# Patient Record
Sex: Male | Born: 1970 | Race: Black or African American | Hispanic: No | Marital: Married | Smoking: Never smoker
Health system: Southern US, Community
[De-identification: ages and names within clinical notes are randomized; demographics above are authoritative.]

## PROBLEM LIST (undated history)

## (undated) DIAGNOSIS — K219 Gastro-esophageal reflux disease without esophagitis: Secondary | ICD-10-CM

## (undated) DIAGNOSIS — IMO0002 Reserved for concepts with insufficient information to code with codable children: Secondary | ICD-10-CM

## (undated) DIAGNOSIS — I10 Essential (primary) hypertension: Secondary | ICD-10-CM

## (undated) DIAGNOSIS — K922 Gastrointestinal hemorrhage, unspecified: Secondary | ICD-10-CM

## (undated) DIAGNOSIS — F1010 Alcohol abuse, uncomplicated: Secondary | ICD-10-CM

---

## 1997-12-02 ENCOUNTER — Encounter: Payer: Self-pay | Admitting: Emergency Medicine

## 1997-12-02 ENCOUNTER — Emergency Department (HOSPITAL_COMMUNITY): Admission: EM | Admit: 1997-12-02 | Discharge: 1997-12-02 | Payer: Self-pay | Admitting: Emergency Medicine

## 2006-11-13 ENCOUNTER — Emergency Department (HOSPITAL_COMMUNITY): Admission: EM | Admit: 2006-11-13 | Discharge: 2006-11-13 | Payer: Self-pay | Admitting: Emergency Medicine

## 2007-12-30 ENCOUNTER — Emergency Department (HOSPITAL_COMMUNITY): Admission: EM | Admit: 2007-12-30 | Discharge: 2007-12-30 | Payer: Self-pay | Admitting: Emergency Medicine

## 2008-04-08 ENCOUNTER — Emergency Department (HOSPITAL_COMMUNITY): Admission: EM | Admit: 2008-04-08 | Discharge: 2008-04-08 | Payer: Self-pay | Admitting: Family Medicine

## 2008-10-29 ENCOUNTER — Inpatient Hospital Stay (HOSPITAL_COMMUNITY): Admission: EM | Admit: 2008-10-29 | Discharge: 2008-10-29 | Payer: Self-pay | Admitting: Emergency Medicine

## 2010-06-26 LAB — TYPE AND SCREEN
ABO/RH(D): A POS
Antibody Screen: NEGATIVE

## 2010-06-26 LAB — BASIC METABOLIC PANEL
Calcium: 9.1 mg/dL (ref 8.4–10.5)
GFR calc Af Amer: 54 mL/min — ABNORMAL LOW (ref >60)
GFR calc non Af Amer: 45 mL/min — ABNORMAL LOW (ref >60)
Glucose, Bld: 119 mg/dL — ABNORMAL HIGH (ref 70–99)
Sodium: 140 mEq/L (ref 135–145)

## 2010-06-26 LAB — CBC
Hemoglobin: 14.3 g/dL (ref 13.0–17.0)
Platelets: 303 10*3/uL (ref 150–400)
RDW: 13.4 % (ref 11.5–15.5)

## 2010-06-26 LAB — DIFFERENTIAL
Basophils Absolute: 0.1 10*3/uL (ref 0.0–0.1)
Lymphocytes Relative: 44 % (ref 12–46)
Monocytes Absolute: 0.9 10*3/uL (ref 0.1–1.0)
Neutro Abs: 6.1 10*3/uL (ref 1.7–7.7)

## 2010-06-26 LAB — ETHANOL: Alcohol, Ethyl (B): 190 mg/dL — ABNORMAL HIGH (ref 0–10)

## 2010-06-26 LAB — ABO/RH: ABO/RH(D): A POS
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
## 2011-05-10 ENCOUNTER — Encounter (HOSPITAL_COMMUNITY): Payer: Self-pay | Admitting: Emergency Medicine

## 2011-05-10 ENCOUNTER — Other Ambulatory Visit: Payer: Self-pay

## 2011-05-10 ENCOUNTER — Emergency Department (INDEPENDENT_AMBULATORY_CARE_PROVIDER_SITE_OTHER)
Admission: EM | Admit: 2011-05-10 | Discharge: 2011-05-10 | Disposition: A | Discharge status: Home / Self Care | Payer: Self-pay | Source: Home / Self Care | Attending: Family Medicine | Admitting: Family Medicine

## 2011-05-10 DIAGNOSIS — I10 Essential (primary) hypertension: Secondary | ICD-10-CM

## 2011-05-10 HISTORY — DX: Gastrointestinal hemorrhage, unspecified: K92.2

## 2011-05-10 HISTORY — DX: Essential (primary) hypertension: I10

## 2011-05-10 HISTORY — DX: Reserved for concepts with insufficient information to code with codable children: IMO0002

## 2011-05-10 LAB — POCT I-STAT, CHEM 8
BUN: 8 mg/dL (ref 6–23)
Chloride: 105 mEq/L (ref 96–112)
Sodium: 141 mEq/L (ref 135–145)

## 2011-05-10 MED ORDER — AMLODIPINE BESYLATE 10 MG PO TABS: 10.0000 mg | ORAL_TABLET | Freq: Every day | ORAL | Status: DC

## 2011-05-10 MED ORDER — HYDROCHLOROTHIAZIDE 25 MG PO TABS: 25.0000 mg | ORAL_TABLET | Freq: Every day | ORAL | Status: DC

## 2011-05-10 NOTE — ED Notes (Addendum)
Pt is oriented x 3 and speaking full sentences without any difficulty breathing. Pt states numbness to the Lt hand finger tips and headache and restless.

## 2011-05-10 NOTE — ED Notes (Signed)
Patient reports intermittent headaches, hand swelling, and restlessness.  Patient has been off medicine for one year.  Patient denies anything new today, just time to do something about it.  Patient denies any pain or sob today.  Reports having brief episodes of can't catch his breath or brief pain in left chest.  Patient reports these episodes of pain or sob do not occur together necessarily, and episodes are very brief: "one to two seconds".

## 2011-05-10 NOTE — ED Provider Notes (Signed)
History     CSN: [id number]  Arrival date & time 05/10/11  4098   First MD Initiated Contact with Patient 05/10/11 1029      Chief Complaint  Patient presents with  . Hypertension    (Consider location/radiation/quality/duration/timing/severity/associated sxs/prior treatment) HPI Comments: Holly presents for evaluation for left hand, tingling, and elevated blood pressure. He reports a history of hypertension, but has not taken his medications in over a year secondary to loss of insurance. He reports he has been on multiple regimens in the past. He reports a significant family history of his mother, who recently passed away from kidney failure from uncontrolled hypertension. He also reports intermittent headaches, but denies one at the current moment. He also reports intermittent left-sided chest pain, but denies any chest pain at the moment. No shortness of breath. No vision changes. No nausea. No vomiting. He is self-employed with a Clinical research associate business and does not have insurance at this moment.  Patient is a 41 y.o. male presenting with hypertension. The history is provided by the patient.  Hypertension This is a chronic problem. The problem has not changed since onset.Associated symptoms include chest pain and headaches. Pertinent negatives include no shortness of breath. The symptoms are aggravated by nothing. The symptoms are relieved by nothing.    Past Medical History  Diagnosis Date  . Hypertension   . Ulcer   . GI bleed     History reviewed. No pertinent past surgical history.  History reviewed. No pertinent family history.  History  Substance Use Topics  . Smoking status: Never Smoker   . Smokeless tobacco: Not on file  . Alcohol Use: Yes      Review of Systems  Constitutional: Negative.   Eyes: Negative.   Respiratory: Negative.  Negative for shortness of breath.   Cardiovascular: Positive for chest pain. Negative for palpitations.  Gastrointestinal:  Negative.   Genitourinary: Negative.   Musculoskeletal: Negative.   Skin: Negative.   Neurological: Positive for headaches. Negative for dizziness, weakness and numbness.    Allergies  Review of patient's allergies indicates no known allergies.  Home Medications   Current Outpatient Rx  Name Route Sig Dispense Refill  . AMLODIPINE BESYLATE 10 MG PO TABS Oral Take 1 tablet (10 mg total) by mouth daily. 30 tablet 1  . HYDROCHLOROTHIAZIDE 25 MG PO TABS Oral Take 1 tablet (25 mg total) by mouth daily. 30 tablet 1    BP 200/120  Pulse 76  Temp(Src) 99 F (37.2 C) (Oral)  Resp 16  SpO2 97%  Physical Exam  Nursing note and vitals reviewed. Constitutional: He is oriented to person, place, and time. He appears well-developed and well-nourished.  HENT:  Head: Normocephalic and atraumatic.  Right Ear: Tympanic membrane normal.  Left Ear: Tympanic membrane normal.  Mouth/Throat: Uvula is midline, oropharynx is clear and moist and mucous membranes are normal.  Eyes: Conjunctivae and EOM are normal. Pupils are equal, round, and reactive to light.  Neck: Normal range of motion.  Cardiovascular: Normal rate, regular rhythm, S1 normal, S2 normal, normal heart sounds and normal pulses.   No murmur heard.      ECG, NSR, 69 bpm, no ST-T wave changes  Pulmonary/Chest: Effort normal and breath sounds normal. He has no decreased breath sounds. He has no wheezes. He has no rhonchi.  Musculoskeletal: Normal range of motion.  Neurological: He is alert and oriented to person, place, and time.  Skin: Skin is warm and dry. He is not diaphoretic.  Psychiatric: His behavior is normal.    ED Course  Procedures (including critical care time)  Labs Reviewed  POCT I-STAT, CHEM 8 - Abnormal; Notable for the following:    Creatinine, Ser 1.40 (*)    Glucose, Bld 102 (*)    All other components within normal limits   No results found.   1. Hypertension       MDM  Labs reviewed; creatinine  1.40; down from 1.75 in 2010; will not start ACE inhibitor at this time; start HCTZ and amlodipine; return in 7 - 10 days for BP check; given [facility] information        Richardo Priest, MD 05/10/11 1736

## 2011-05-10 NOTE — Discharge Instructions (Signed)
Your blood pressure is elevated today. Your EKG was normal today. Your kidney function shows an abnormal level as well; normal is < 1. Your level is 1.40. Therefore, I am not comfortable starting an ACE inhibitor (such as lisinopril) at this time. I have started another type of medication, a calcium channel blocker called amlodipine, along with a diuretic, hydrochlorothiazide. Return in 7 to 10 days for re-evaluation of your pressure. You also need to establish care with a primary care provider as discussed. I have provided information for two clinics that do not require insurance. You may also call [facility] at [phone] and ask about primary care clinics: the Department of Family Medicine [phone] - [phone], or the Department of Internal Medicine. Start medications as directed. Return to care, or to the Emergency Department, should you develop any symptoms not improve, or your health worsens in any way, such as chest pain, shortness of breath, vision changes, nausea, vomiting, numbness, tingling, or weakness.

## 2011-12-23 ENCOUNTER — Emergency Department (HOSPITAL_COMMUNITY): Payer: Self-pay

## 2011-12-23 ENCOUNTER — Emergency Department (HOSPITAL_COMMUNITY)
Admission: EM | Admit: 2011-12-23 | Discharge: 2011-12-23 | Disposition: A | Discharge status: Home / Self Care | Payer: Self-pay | Attending: Emergency Medicine | Admitting: Emergency Medicine

## 2011-12-23 ENCOUNTER — Encounter (HOSPITAL_COMMUNITY): Payer: Self-pay

## 2011-12-23 DIAGNOSIS — J40 Bronchitis, not specified as acute or chronic: Secondary | ICD-10-CM

## 2011-12-23 DIAGNOSIS — I10 Essential (primary) hypertension: Secondary | ICD-10-CM

## 2011-12-23 DIAGNOSIS — R05 Cough: Secondary | ICD-10-CM

## 2011-12-23 DIAGNOSIS — R059 Cough, unspecified: Secondary | ICD-10-CM

## 2011-12-23 MED ORDER — ALBUTEROL SULFATE HFA 108 (90 BASE) MCG/ACT IN AERS: 1.0000 | INHALATION_SPRAY | Freq: Four times a day (QID) | RESPIRATORY_TRACT | Status: DC | PRN

## 2011-12-23 MED ORDER — ALBUTEROL SULFATE (5 MG/ML) 0.5% IN NEBU
5.0000 mg | INHALATION_SOLUTION | Freq: Once | RESPIRATORY_TRACT | Status: AC
  Administered 2011-12-23: 5 mg via RESPIRATORY_TRACT
  Filled 2011-12-23: qty 1

## 2011-12-23 MED ORDER — AMLODIPINE BESYLATE 10 MG PO TABS
10.0000 mg | ORAL_TABLET | Freq: Once | ORAL | Status: AC
  Administered 2011-12-23: 10 mg via ORAL
  Filled 2011-12-23: qty 1

## 2011-12-23 MED ORDER — HYDROCHLOROTHIAZIDE 12.5 MG PO TABS: 25.0000 mg | ORAL_TABLET | Freq: Every day | ORAL | Status: DC

## 2011-12-23 MED ORDER — AMLODIPINE BESYLATE 2.5 MG PO TABS: 10.0000 mg | ORAL_TABLET | Freq: Every day | ORAL | Status: DC

## 2011-12-23 MED ORDER — HYDROCHLOROTHIAZIDE 12.5 MG PO CAPS
25.0000 mg | ORAL_CAPSULE | Freq: Once | ORAL | Status: AC
  Administered 2011-12-23: 25 mg via ORAL
  Filled 2011-12-23: qty 2

## 2011-12-23 NOTE — ED Notes (Signed)
Patient reports chest congestion and cough x3 weeks. Pt denies history of sinus congestion or fevers. Pt denies productive cough.   Pt reports blood pressure is high today because he is out of his BP medication x months.  Pt states he is unable to see a regular MD as he lack insurance.

## 2011-12-23 NOTE — ED Provider Notes (Addendum)
History     CSN: [id number]  Arrival date & time 12/23/11  1304   First MD Initiated Contact with Patient 12/23/11 1543      Chief Complaint  Patient presents with  . Shortness of Breath  . Cough    (Consider location/radiation/quality/duration/timing/severity/associated sxs/prior treatment) The history is provided by the patient.  Gatlin Kittell is a 41 y.o. male hx of HTN here with cough, SOB. Cough and congestion for 3 weeks. Nonproductive cough. Felt SOB when he coughs. No fevers or chills. No hx of PE or recent travel or leg swelling. SOB not exertional. No sinus congestion or sore throat. He is uncompliant on his HTN meds and ran out of it several months ago. He is not a smoker.    Past Medical History  Diagnosis Date  . Hypertension   . Ulcer   . GI bleed     History reviewed. No pertinent past surgical history.  No family history on file.  History  Substance Use Topics  . Smoking status: Never Smoker   . Smokeless tobacco: Not on file  . Alcohol Use: Yes     occaionally      Review of Systems  Respiratory: Positive for cough and shortness of breath.   All other systems reviewed and are negative.    Allergies  Review of patient's allergies indicates no known allergies.  Home Medications   Current Outpatient Rx  Name Route Sig Dispense Refill  . ACETAMINOPHEN 325 MG RE SUPP Rectal Place 650 mg rectally every 4 (four) hours as needed.    Marland Kitchen AMLODIPINE BESYLATE 10 MG PO TABS Oral Take 1 tablet (10 mg total) by mouth daily. 30 tablet 1  . HYDROCHLOROTHIAZIDE 25 MG PO TABS Oral Take 1 tablet (25 mg total) by mouth daily. 30 tablet 1  . ALBUTEROL SULFATE HFA 108 (90 BASE) MCG/ACT IN AERS Inhalation Inhale 1-2 puffs into the lungs every 6 (six) hours as needed for wheezing. 1 Inhaler 0  . AMLODIPINE BESYLATE 2.5 MG PO TABS Oral Take 4 tablets (10 mg total) by mouth daily. 30 tablet 0  . HYDROCHLOROTHIAZIDE 12.5 MG PO TABS Oral Take 2 tablets (25 mg total) by  mouth daily. 30 tablet 0    BP 170/110  Pulse 103  Temp 98.7 F (37.1 C) (Oral)  Resp 20  Wt 240 lb (108.863 kg)  SpO2 96%  Physical Exam  Nursing note and vitals reviewed. Constitutional: He is oriented to person, place, and time. He appears well-developed and well-nourished.  HENT:  Head: Normocephalic.  Mouth/Throat: Oropharynx is clear and moist.  Eyes: Conjunctivae normal are normal. Pupils are equal, round, and reactive to light.  Neck: Normal range of motion. Neck supple.  Cardiovascular: Normal rate, regular rhythm and normal heart sounds.   Pulmonary/Chest: Effort normal and breath sounds normal. No respiratory distress. He has no wheezes. He has no rales.  Abdominal: Soft. Bowel sounds are normal.  Musculoskeletal: Normal range of motion. He exhibits no edema and no tenderness.  Neurological: He is alert and oriented to person, place, and time.  Skin: Skin is warm and dry.  Psychiatric: He has a normal mood and affect. His behavior is normal. Judgment and thought content normal.    ED Course  Procedures (including critical care time)  Labs Reviewed - No data to display Dg Chest 2 View  12/23/2011  *RADIOLOGY REPORT*  Clinical Data: Wheezing.  Short of breath.  Cough.  CHEST - 2 VIEW  Comparison: 10/29/2008  Findings: Heart size is normal.  Mediastinal shadows are normal. Lungs are clear.  No effusions.  There are be mild central bronchial thickening.  No acute bony finding.  IMPRESSION: No infiltrate or collapse.  Possible mild central bronchial thickening.   Original Report Authenticated By: Thomasenia Sales, M.D.      1. Bronchitis   2. Cough   3. Hypertension      Date: 12/23/2011  Rate: 104  Rhythm: normal sinus rhythm  QRS Axis: normal  Intervals: normal  ST/T Wave abnormalities: normal  Conduction Disutrbances:none  Narrative Interpretation:   Old EKG Reviewed: unchanged     MDM  Jaze Colantonio is a 41 y.o. male here with HTN and cough. Cough x 3  weeks. Likely viral syndrome. Will get CXR r/o pneumonia. Will give him his antihypertensive meds. Not concerning for PE and PERC negative (the HR on the monitor is 90s not 103).   5:38 PM CXR showed bronchitis and no pneumonia. Patient felt better after nebs and given BP meds. Repeat vitals showed BP stable, not tachycardic. Will give patient refill of his meds and give albuterol for bronchitis. Will give resource guide.        Richardean Canal, MD 12/23/11 1739  Richardean Canal, MD 12/23/11 [phone]

## 2011-12-23 NOTE — ED Notes (Signed)
Pt c/o sob x 3 weeks with cough. Sat 96% on RA., reports chest congestion. Denied pain

## 2012-05-09 ENCOUNTER — Emergency Department (INDEPENDENT_AMBULATORY_CARE_PROVIDER_SITE_OTHER)
Admission: EM | Admit: 2012-05-09 | Discharge: 2012-05-09 | Disposition: A | Discharge status: Home / Self Care | Payer: Self-pay | Source: Home / Self Care | Attending: Emergency Medicine | Admitting: Emergency Medicine

## 2012-05-09 ENCOUNTER — Encounter (HOSPITAL_COMMUNITY): Payer: Self-pay

## 2012-05-09 DIAGNOSIS — I10 Essential (primary) hypertension: Secondary | ICD-10-CM

## 2012-05-09 DIAGNOSIS — N289 Disorder of kidney and ureter, unspecified: Secondary | ICD-10-CM

## 2012-05-09 LAB — POCT I-STAT, CHEM 8
BUN: 14 mg/dL (ref 6–23)
Chloride: 106 mEq/L (ref 96–112)
Creatinine, Ser: 1.4 mg/dL — ABNORMAL HIGH (ref 0.50–1.35)
Potassium: 4.2 mEq/L (ref 3.5–5.1)
Sodium: 140 mEq/L (ref 135–145)

## 2012-05-09 MED ORDER — HYDROCHLOROTHIAZIDE 12.5 MG PO TABS: 25.0000 mg | ORAL_TABLET | Freq: Every day | ORAL | Status: AC

## 2012-05-09 NOTE — ED Notes (Signed)
RN Harriett Sine informed of pts High BP

## 2012-05-09 NOTE — ED Notes (Signed)
Pt states he has not been on BP medication since his last visit ( 1 year ago) Discussed hazards of unregulated BP

## 2012-05-09 NOTE — ED Provider Notes (Addendum)
History     CSN: [id number]  Arrival date & time 05/09/12  1040   First MD Initiated Contact with Patient 05/09/12 1130      Chief Complaint  Patient presents with  . Hypertension    (Consider location/radiation/quality/duration/timing/severity/associated sxs/prior treatment) HPI Comments: Patient presents urgent care almost a year after his last visit. Requesting blood pressure medicines. Patient describes that he has been without taking medicines for many months now. Currently denies any chest pains, shortness of breath or lower extremity swelling. Does describe that occasionally he feels somewhat with a headache that he attributes to have his blood pressure elevated. This time he feels he will establish with a primary care Dr. Marland Kitchen if I don't do it my wife will make me so".  Patient is a 42 y.o. male presenting with hypertension. The history is provided by the patient.  Hypertension This is a chronic problem. The problem has not changed since onset.Associated symptoms include headaches. Pertinent negatives include no chest pain and no shortness of breath. Nothing aggravates the symptoms. He has tried nothing for the symptoms.    Past Medical History  Diagnosis Date  . Hypertension   . Ulcer   . GI bleed     History reviewed. No pertinent past surgical history.  Family History  Problem Relation Age of Onset  . Diabetes Mother   . Hyperlipidemia Mother     History  Substance Use Topics  . Smoking status: Never Smoker   . Smokeless tobacco: Not on file  . Alcohol Use: Yes     Comment: occaionally      Review of Systems  Constitutional: Positive for chills. Negative for activity change and appetite change.  Respiratory: Negative for cough and shortness of breath.   Cardiovascular: Negative for chest pain, palpitations and leg swelling.  Neurological: Positive for headaches. Negative for dizziness, facial asymmetry, speech difficulty, weakness and numbness.     Allergies  Review of patient's allergies indicates no known allergies.  Home Medications   Current Outpatient Rx  Name  Route  Sig  Dispense  Refill  . acetaminophen (TYLENOL) 325 MG suppository   Rectal   Place 650 mg rectally every 4 (four) hours as needed.         Marland Kitchen albuterol (PROVENTIL HFA;VENTOLIN HFA) 108 (90 BASE) MCG/ACT inhaler   Inhalation   Inhale 1-2 puffs into the lungs every 6 (six) hours as needed for wheezing.   1 Inhaler   0   . amLODipine (NORVASC) 10 MG tablet   Oral   Take 1 tablet (10 mg total) by mouth daily.   30 tablet   1   . amLODipine (NORVASC) 2.5 MG tablet   Oral   Take 4 tablets (10 mg total) by mouth daily.   30 tablet   0   . hydrochlorothiazide (HYDRODIURIL) 12.5 MG tablet   Oral   Take 2 tablets (25 mg total) by mouth daily.   30 tablet   0   . hydrochlorothiazide (HYDRODIURIL) 25 MG tablet   Oral   Take 1 tablet (25 mg total) by mouth daily.   30 tablet   1     BP 178/102  Pulse 64  Temp(Src) 98.2 F (36.8 C) (Oral)  Resp 18  SpO2 96%  Physical Exam  Vitals reviewed. Constitutional: He is oriented to person, place, and time. He appears well-developed and well-nourished.  Cardiovascular: Normal rate, regular rhythm and normal heart sounds.  Exam reveals no gallop and no friction  rub.   No murmur heard. Pulmonary/Chest: Effort normal and breath sounds normal.  Neurological: He is alert and oriented to person, place, and time. He has normal strength and normal reflexes. He displays normal reflexes. No cranial nerve deficit or sensory deficit. He exhibits normal muscle tone. Coordination normal.  Skin: Skin is warm.    ED Course  Procedures (including critical care time)  Labs Reviewed  POCT I-STAT, CHEM 8 - Abnormal; Notable for the following:    Creatinine, Ser 1.40 (*)    Hemoglobin 17.3 (*)    All other components within normal limits   No results found.   1. Hypertension      Hand held device   Normal sinus rhythm, ventricular rate 70 beats per minute. No ST or T wave changes suggestive of acute or chronic ischemic events.  MDM  Patient will be restarted on hydrochlorothiazide. Information has been provided to establish continuity of care with the active adult Center at [facility]. Baseline electrolytes and creatinine obtained today. Patient agrees with current treatment initiation and with close follow-up. We discussed the short-term and long-term risk associated with untreated oral control blood pressure. Patient understands and agrees with treatment plan  Abnormal creatinine levels were discussed with patient. Has an indication of some degree of renal insufficiency. Have recommended patient that he will need close followup of his creatinine levels as his blood pressure gets under control. Patient understands and agrees.      Jimmie Molly, MD 05/09/12 1221  Jimmie Molly, MD 05/09/12 [phone]

## 2012-08-30 ENCOUNTER — Encounter (HOSPITAL_COMMUNITY): Payer: Self-pay

## 2012-08-30 ENCOUNTER — Emergency Department (HOSPITAL_COMMUNITY): Payer: Worker's Compensation

## 2012-08-30 ENCOUNTER — Emergency Department (HOSPITAL_COMMUNITY)
Admission: EM | Admit: 2012-08-30 | Discharge: 2012-08-30 | Disposition: A | Discharge status: Home / Self Care | Payer: Worker's Compensation | Attending: Emergency Medicine | Admitting: Emergency Medicine

## 2012-08-30 DIAGNOSIS — S62639A Displaced fracture of distal phalanx of unspecified finger, initial encounter for closed fracture: Secondary | ICD-10-CM | POA: Insufficient documentation

## 2012-08-30 DIAGNOSIS — Y9289 Other specified places as the place of occurrence of the external cause: Secondary | ICD-10-CM | POA: Insufficient documentation

## 2012-08-30 DIAGNOSIS — Y9389 Activity, other specified: Secondary | ICD-10-CM | POA: Insufficient documentation

## 2012-08-30 DIAGNOSIS — R209 Unspecified disturbances of skin sensation: Secondary | ICD-10-CM | POA: Insufficient documentation

## 2012-08-30 DIAGNOSIS — Y990 Civilian activity done for income or pay: Secondary | ICD-10-CM | POA: Insufficient documentation

## 2012-08-30 DIAGNOSIS — W230XXA Caught, crushed, jammed, or pinched between moving objects, initial encounter: Secondary | ICD-10-CM | POA: Insufficient documentation

## 2012-08-30 DIAGNOSIS — I10 Essential (primary) hypertension: Secondary | ICD-10-CM | POA: Insufficient documentation

## 2012-08-30 DIAGNOSIS — Z8719 Personal history of other diseases of the digestive system: Secondary | ICD-10-CM | POA: Insufficient documentation

## 2012-08-30 DIAGNOSIS — Z872 Personal history of diseases of the skin and subcutaneous tissue: Secondary | ICD-10-CM | POA: Insufficient documentation

## 2012-08-30 MED ORDER — AMLODIPINE BESYLATE 2.5 MG PO TABS: 10.0000 mg | ORAL_TABLET | Freq: Every day | ORAL | Status: DC

## 2012-08-30 MED ORDER — HYDROCODONE-ACETAMINOPHEN 5-325 MG PO TABS: 1.0000 | ORAL_TABLET | Freq: Four times a day (QID) | ORAL | Status: DC | PRN

## 2012-08-30 MED ORDER — HYDROCODONE-ACETAMINOPHEN 5-325 MG PO TABS
2.0000 | ORAL_TABLET | Freq: Once | ORAL | Status: AC
  Administered 2012-08-30: 2 via ORAL
  Filled 2012-08-30: qty 2

## 2012-08-30 MED ORDER — HYDROCHLOROTHIAZIDE 25 MG PO TABS: 25.0000 mg | ORAL_TABLET | Freq: Every day | ORAL | Status: DC
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
## 2012-08-30 NOTE — ED Notes (Signed)
Pt ambulatory to exam room with steady gait.  

## 2012-08-30 NOTE — ED Provider Notes (Signed)
History  This chart was scribed for non-physician practitioner Magnus Sinning, PA-C working with Richardean Canal, MD, by Candelaria Stagers, ED Scribe. This patient was seen in room WTR6/WTR6 and the patient's care was started at 9:14 PM   CSN: [id number]  Arrival date & time 08/30/12  1803   First MD Initiated Contact with Patient 08/30/12 1955      Chief Complaint  Patient presents with  . Finger Injury     The history is provided by the patient. No language interpreter was used.   HPI Comments: Warren [name] is a 42 y.o. male who presents to the Emergency Department complaining of sudden onset of left ring finger pain after he smashed the finger between two bricks while at work earlier today.  Pt is experiencing swelling and bruising to the finger.  He reports numbness to the tip of the finger.  He has taken tylenol with some relief.  Pt is also requesting refills of HTN medications stating that he has been out for two months.  His BP in the ED 174/119 mmHg.  He denies headache, chest pain, SOB, vision changes or focal weakness.     Past Medical History  Diagnosis Date  . Hypertension   . Ulcer   . GI bleed     History reviewed. No pertinent past surgical history.  Family History  Problem Relation Age of Onset  . Diabetes Mother   . Hyperlipidemia Mother     History  Substance Use Topics  . Smoking status: Never Smoker   . Smokeless tobacco: Not on file  . Alcohol Use: Yes     Comment: occaionally      Review of Systems  Musculoskeletal:       Left fourth digit injury.   All other systems reviewed and are negative.    Allergies  Review of patient's allergies indicates no known allergies.  Home Medications   Current Outpatient Rx  Name  Route  Sig  Dispense  Refill  . acetaminophen (TYLENOL) 500 MG tablet   Oral   Take 1,000 mg by mouth every 6 (six) hours as needed for pain.         Marland Kitchen amLODipine (NORVASC) 2.5 MG tablet   Oral   Take 4 tablets (10 mg  total) by mouth daily.   30 tablet   0   . hydrochlorothiazide (HYDRODIURIL) 12.5 MG tablet   Oral   Take 2 tablets (25 mg total) by mouth daily.   30 tablet   0     BP 174/113  Pulse 63  Temp(Src) 98.8 F (37.1 C) (Oral)  Resp 16  SpO2 99%  Physical Exam  Nursing note and vitals reviewed. Constitutional: He is oriented to person, place, and time. He appears well-developed and well-nourished. No distress.  HENT:  Head: Normocephalic and atraumatic.  Eyes: EOM are normal.  Neck: Neck supple. No tracheal deviation present.  Cardiovascular: Normal rate.   Good cap refill of left fourth digit.  Good radial pulses  Pulmonary/Chest: Effort normal. No respiratory distress.  Musculoskeletal: Normal range of motion.  Decreased ROM of the DIP, full ROM of the PIP, full ROM of the MCP of the left fourth digit.  Tender to palpation. Mild swelling and bruising of the finger pad of the left fourth digit.  Skin intact.  No subungual hematoma.     Neurological: He is alert and oriented to person, place, and time.  Sensation intact.   Skin: Skin is warm and  dry.  Psychiatric: He has a normal mood and affect. His behavior is normal.    ED Course  Procedures   DIAGNOSTIC STUDIES: Oxygen Saturation is 99% on room air, normal by my interpretation.    COORDINATION OF CARE:  9:19 PM Discussed course of care with pt which includes finger splint.  Advised follow up with hand specialist with sx do not improve.   Labs Reviewed - No data to display Dg Finger Ring Left  08/30/2012   *RADIOLOGY REPORT*  Clinical Data: Smashed left ring finger between two bricks  LEFT RING FINGER 2+V  Comparison: None.  Findings:  There is a comminuted, minimally displaced fracture involving the tuft of the fourth digit.  No intra-articular extension.  Expected adjacent soft tissue swelling.  No radiopaque foreign body.  IMPRESSION: Comminuted, minimally displaced fracture involving the tuft of the fourth digit  without intra-articular extension or radiopaque foreign body.   Original Report Authenticated By: Tacey Ruiz, MD     No diagnosis found.    MDM  Patient with a closed comminuted tuft fracture of the left fourth digit with minimal displacement.  Patient neurovascularly intact.  Finger splinted and patient given referral to Hand Surgery.  I personally performed the services described in this documentation, which was scribed in my presence. The recorded information has been reviewed and is accurate.        Pascal Lux [place], PA-C 08/31/12 [phone]

## 2012-08-30 NOTE — ED Notes (Signed)
Pt states out of b/p meds

## 2012-09-01 NOTE — ED Provider Notes (Signed)
Medical screening examination/treatment/procedure(s) were performed by non-physician practitioner and as supervising physician I was immediately available for consultation/collaboration.   Merrie Epler H Derryck Shahan, MD 09/01/12 1456 

## 2016-08-17 DIAGNOSIS — N182 Chronic kidney disease, stage 2 (mild): Secondary | ICD-10-CM | POA: Diagnosis not present

## 2016-08-17 DIAGNOSIS — I10 Essential (primary) hypertension: Secondary | ICD-10-CM | POA: Diagnosis not present

## 2016-08-17 DIAGNOSIS — E7800 Pure hypercholesterolemia, unspecified: Secondary | ICD-10-CM | POA: Diagnosis not present

## 2016-08-17 DIAGNOSIS — Z125 Encounter for screening for malignant neoplasm of prostate: Secondary | ICD-10-CM | POA: Diagnosis not present

## 2016-09-14 DIAGNOSIS — I10 Essential (primary) hypertension: Secondary | ICD-10-CM | POA: Diagnosis not present

## 2016-09-14 DIAGNOSIS — N182 Chronic kidney disease, stage 2 (mild): Secondary | ICD-10-CM | POA: Diagnosis not present

## 2016-12-13 DIAGNOSIS — I10 Essential (primary) hypertension: Secondary | ICD-10-CM | POA: Diagnosis not present

## 2016-12-13 DIAGNOSIS — E7800 Pure hypercholesterolemia, unspecified: Secondary | ICD-10-CM | POA: Diagnosis not present

## 2016-12-13 DIAGNOSIS — N182 Chronic kidney disease, stage 2 (mild): Secondary | ICD-10-CM | POA: Diagnosis not present

## 2017-08-10 DIAGNOSIS — Z23 Encounter for immunization: Secondary | ICD-10-CM | POA: Diagnosis not present

## 2017-08-10 DIAGNOSIS — Z0000 Encounter for general adult medical examination without abnormal findings: Secondary | ICD-10-CM | POA: Diagnosis not present

## 2017-08-10 DIAGNOSIS — E7800 Pure hypercholesterolemia, unspecified: Secondary | ICD-10-CM | POA: Diagnosis not present

## 2017-09-08 DIAGNOSIS — R945 Abnormal results of liver function studies: Secondary | ICD-10-CM | POA: Diagnosis not present

## 2017-09-08 DIAGNOSIS — E782 Mixed hyperlipidemia: Secondary | ICD-10-CM | POA: Diagnosis not present

## 2017-11-01 ENCOUNTER — Other Ambulatory Visit (HOSPITAL_COMMUNITY): Payer: Self-pay

## 2017-11-01 ENCOUNTER — Other Ambulatory Visit: Payer: Self-pay | Admitting: Nurse Practitioner

## 2017-11-01 ENCOUNTER — Other Ambulatory Visit: Payer: Self-pay

## 2017-11-01 ENCOUNTER — Ambulatory Visit
Admission: RE | Admit: 2017-11-01 | Discharge: 2017-11-01 | Disposition: A | Discharge status: Home / Self Care | Payer: No Typology Code available for payment source | Source: Ambulatory Visit | Attending: Nurse Practitioner | Admitting: Nurse Practitioner

## 2017-11-01 DIAGNOSIS — M25512 Pain in left shoulder: Secondary | ICD-10-CM

## 2017-11-01 DIAGNOSIS — M549 Dorsalgia, unspecified: Secondary | ICD-10-CM

## 2017-11-01 DIAGNOSIS — M542 Cervicalgia: Secondary | ICD-10-CM

## 2017-11-02 ENCOUNTER — Emergency Department (HOSPITAL_COMMUNITY): Payer: No Typology Code available for payment source

## 2017-11-02 ENCOUNTER — Encounter (HOSPITAL_COMMUNITY): Payer: Self-pay

## 2017-11-02 ENCOUNTER — Other Ambulatory Visit: Payer: Self-pay

## 2017-11-02 ENCOUNTER — Emergency Department (HOSPITAL_COMMUNITY)
Admission: EM | Admit: 2017-11-02 | Discharge: 2017-11-02 | Disposition: A | Discharge status: Home / Self Care | Payer: No Typology Code available for payment source | Attending: Emergency Medicine | Admitting: Emergency Medicine

## 2017-11-02 DIAGNOSIS — R51 Headache: Secondary | ICD-10-CM | POA: Diagnosis not present

## 2017-11-02 DIAGNOSIS — Z041 Encounter for examination and observation following transport accident: Secondary | ICD-10-CM | POA: Diagnosis present

## 2017-11-02 DIAGNOSIS — M25511 Pain in right shoulder: Secondary | ICD-10-CM | POA: Insufficient documentation

## 2017-11-02 DIAGNOSIS — M542 Cervicalgia: Secondary | ICD-10-CM | POA: Diagnosis not present

## 2017-11-02 DIAGNOSIS — M25562 Pain in left knee: Secondary | ICD-10-CM | POA: Insufficient documentation

## 2017-11-02 DIAGNOSIS — M545 Low back pain: Secondary | ICD-10-CM | POA: Diagnosis not present

## 2017-11-02 DIAGNOSIS — S4991XA Unspecified injury of right shoulder and upper arm, initial encounter: Secondary | ICD-10-CM | POA: Diagnosis not present

## 2017-11-02 DIAGNOSIS — M7918 Myalgia, other site: Secondary | ICD-10-CM

## 2017-11-02 DIAGNOSIS — S8992XA Unspecified injury of left lower leg, initial encounter: Secondary | ICD-10-CM | POA: Diagnosis not present

## 2017-11-02 MED ORDER — ACETAMINOPHEN 500 MG PO TABS
1000.0000 mg | ORAL_TABLET | Freq: Once | ORAL | Status: AC
  Administered 2017-11-02: 1000 mg via ORAL
  Filled 2017-11-02: qty 2

## 2017-11-02 MED ORDER — METHOCARBAMOL 500 MG PO TABS: 500.0000 mg | ORAL_TABLET | Freq: Two times a day (BID) | ORAL | 0 refills | Status: DC

## 2017-11-02 MED ORDER — METHOCARBAMOL 500 MG PO TABS
1000.0000 mg | ORAL_TABLET | Freq: Once | ORAL | Status: AC
  Administered 2017-11-02: 500 mg via ORAL
  Filled 2017-11-02: qty 2

## 2017-11-02 NOTE — ED Notes (Signed)
Bed: WTR5 Expected date:  Expected time:  Means of arrival:  Comments: 
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
## 2017-11-02 NOTE — Discharge Instructions (Signed)
Your x-rays look good today.  The pain your experiencing is likely due to muscle strain, you may take Tylenol and Robaxin as needed for pain management. Do not combine with any other over-the-counter pain reliever. The muscle soreness should improve over the next week. Follow up with your family doctor in the next week for a recheck if you are still having symptoms. Return to ED if pain is worsening, you develop weakness or numbness of extremities, or new or concerning symptoms develop.

## 2018-02-13 DIAGNOSIS — E782 Mixed hyperlipidemia: Secondary | ICD-10-CM | POA: Diagnosis not present

## 2018-02-13 DIAGNOSIS — Z23 Encounter for immunization: Secondary | ICD-10-CM | POA: Diagnosis not present

## 2018-02-13 DIAGNOSIS — K219 Gastro-esophageal reflux disease without esophagitis: Secondary | ICD-10-CM | POA: Diagnosis not present

## 2018-02-13 DIAGNOSIS — I10 Essential (primary) hypertension: Secondary | ICD-10-CM | POA: Diagnosis not present

## 2018-03-29 ENCOUNTER — Other Ambulatory Visit: Payer: Self-pay | Admitting: Internal Medicine

## 2018-03-29 DIAGNOSIS — I803 Phlebitis and thrombophlebitis of lower extremities, unspecified: Secondary | ICD-10-CM | POA: Diagnosis not present

## 2018-03-30 ENCOUNTER — Ambulatory Visit
Admission: RE | Admit: 2018-03-30 | Discharge: 2018-03-30 | Disposition: A | Discharge status: Home / Self Care | Payer: 59 | Source: Ambulatory Visit | Attending: Internal Medicine | Admitting: Internal Medicine

## 2018-03-30 DIAGNOSIS — I803 Phlebitis and thrombophlebitis of lower extremities, unspecified: Secondary | ICD-10-CM

## 2018-03-30 DIAGNOSIS — I8001 Phlebitis and thrombophlebitis of superficial vessels of right lower extremity: Secondary | ICD-10-CM | POA: Diagnosis not present

## 2018-05-02 DIAGNOSIS — J209 Acute bronchitis, unspecified: Secondary | ICD-10-CM | POA: Diagnosis not present

## 2018-05-17 DIAGNOSIS — R0981 Nasal congestion: Secondary | ICD-10-CM | POA: Diagnosis not present

## 2018-05-17 DIAGNOSIS — R490 Dysphonia: Secondary | ICD-10-CM | POA: Diagnosis not present

## 2018-10-08 ENCOUNTER — Emergency Department (HOSPITAL_COMMUNITY)
Admission: EM | Admit: 2018-10-08 | Discharge: 2018-10-08 | Disposition: A | Discharge status: Home / Self Care | Payer: 59 | Attending: Neurosurgery | Admitting: Neurosurgery

## 2018-10-08 ENCOUNTER — Other Ambulatory Visit: Payer: Self-pay | Admitting: *Deleted

## 2018-10-08 ENCOUNTER — Other Ambulatory Visit: Payer: Self-pay

## 2018-10-08 ENCOUNTER — Encounter (HOSPITAL_COMMUNITY): Payer: Self-pay | Admitting: Emergency Medicine

## 2018-10-08 DIAGNOSIS — R42 Dizziness and giddiness: Secondary | ICD-10-CM | POA: Insufficient documentation

## 2018-10-08 DIAGNOSIS — Z20822 Contact with and (suspected) exposure to covid-19: Secondary | ICD-10-CM

## 2018-10-08 DIAGNOSIS — Z5321 Procedure and treatment not carried out due to patient leaving prior to being seen by health care provider: Secondary | ICD-10-CM | POA: Diagnosis not present

## 2018-10-08 LAB — CBC
HCT: 45.1 % (ref 39.0–52.0)
Hemoglobin: 15.1 g/dL (ref 13.0–17.0)
MCH: 29.8 pg (ref 26.0–34.0)
MCHC: 33.5 g/dL (ref 30.0–36.0)
MCV: 89.1 fL (ref 80.0–100.0)
Platelets: 344 10*3/uL (ref 150–400)
RBC: 5.06 MIL/uL (ref 4.22–5.81)
RDW: 13.2 % (ref 11.5–15.5)
WBC: 7.8 10*3/uL (ref 4.0–10.5)
nRBC: 0 % (ref 0.0–0.2)

## 2018-10-08 NOTE — ED Notes (Signed)
Pt called for room x3 with no answer. 

## 2018-10-08 NOTE — Addendum Note (Signed)
Addended by: Katera Rybka M on: 10/08/2018 08:50 PM   Modules accepted: Orders  

## 2018-10-08 NOTE — ED Triage Notes (Signed)
Pt reports dizziness and a headache for the past 3 days.

## 2018-10-10 LAB — NOVEL CORONAVIRUS, NAA: SARS-CoV-2, NAA: NOT DETECTED
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
## 2020-08-31 ENCOUNTER — Other Ambulatory Visit: Payer: Self-pay | Admitting: Urology

## 2020-09-01 LAB — PSA: Prostate Specific Ag, Serum: 0.4 ng/mL (ref 0.0–4.0)

## 2020-09-02 ENCOUNTER — Telehealth: Payer: Self-pay

## 2020-09-02 NOTE — Telephone Encounter (Signed)
Patient informed normal PSA results(0.4), recheck in 1 year. Patient verbalized understanding.  

## 2021-06-30 ENCOUNTER — Other Ambulatory Visit: Payer: Self-pay | Admitting: Internal Medicine

## 2021-06-30 ENCOUNTER — Ambulatory Visit
Admission: RE | Admit: 2021-06-30 | Discharge: 2021-06-30 | Disposition: A | Discharge status: Home / Self Care | Payer: 59 | Source: Ambulatory Visit | Attending: Internal Medicine | Admitting: Internal Medicine

## 2021-06-30 DIAGNOSIS — R0609 Other forms of dyspnea: Secondary | ICD-10-CM

## 2021-07-09 ENCOUNTER — Ambulatory Visit: Payer: 59 | Admitting: Student

## 2021-07-09 ENCOUNTER — Inpatient Hospital Stay: Payer: 59

## 2021-07-09 ENCOUNTER — Encounter: Payer: Self-pay | Admitting: Student

## 2021-07-09 VITALS — BP 135/91 | HR 102 | Temp 97.8°F | Resp 16 | Ht 69.0 in | Wt 233.0 lb

## 2021-07-09 DIAGNOSIS — R000 Tachycardia, unspecified: Secondary | ICD-10-CM

## 2021-07-09 DIAGNOSIS — R0602 Shortness of breath: Secondary | ICD-10-CM

## 2021-07-09 DIAGNOSIS — I517 Cardiomegaly: Secondary | ICD-10-CM

## 2021-07-09 DIAGNOSIS — R002 Palpitations: Secondary | ICD-10-CM
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
## 2021-07-23 ENCOUNTER — Other Ambulatory Visit: Payer: 59

## 2021-07-30 ENCOUNTER — Other Ambulatory Visit: Payer: 59

## 2021-08-13 ENCOUNTER — Other Ambulatory Visit: Payer: 59

## 2021-08-20 ENCOUNTER — Ambulatory Visit: Payer: 59 | Admitting: Student

## 2021-08-20 ENCOUNTER — Other Ambulatory Visit: Payer: 59

## 2021-08-25 ENCOUNTER — Other Ambulatory Visit: Payer: 59

## 2021-09-17 ENCOUNTER — Ambulatory Visit: Payer: 59 | Admitting: Student

## 2021-09-17 ENCOUNTER — Emergency Department (HOSPITAL_COMMUNITY): Payer: 59

## 2021-09-17 ENCOUNTER — Emergency Department (HOSPITAL_COMMUNITY)
Admission: EM | Admit: 2021-09-17 | Discharge: 2021-09-17 | Discharge status: Against Medical Advice | Payer: 59 | Attending: Emergency Medicine | Admitting: Emergency Medicine

## 2021-09-17 ENCOUNTER — Encounter (HOSPITAL_COMMUNITY): Payer: Self-pay

## 2021-09-17 DIAGNOSIS — Z5321 Procedure and treatment not carried out due to patient leaving prior to being seen by health care provider: Secondary | ICD-10-CM | POA: Diagnosis not present

## 2021-09-17 DIAGNOSIS — R1032 Left lower quadrant pain: Secondary | ICD-10-CM | POA: Insufficient documentation

## 2021-09-17 LAB — COMPREHENSIVE METABOLIC PANEL
ALT: 36 U/L (ref 0–44)
AST: 54 U/L — ABNORMAL HIGH (ref 15–41)
Albumin: 3.8 g/dL (ref 3.5–5.0)
Alkaline Phosphatase: 31 U/L — ABNORMAL LOW (ref 38–126)
Anion gap: 12 (ref 5–15)
BUN: 11 mg/dL (ref 6–20)
CO2: 28 mmol/L (ref 22–32)
Calcium: 9.4 mg/dL (ref 8.9–10.3)
Chloride: 100 mmol/L (ref 98–111)
Creatinine, Ser: 1.1 mg/dL (ref 0.61–1.24)
GFR, Estimated: >60 mL/min (ref >60)
Glucose, Bld: 118 mg/dL — ABNORMAL HIGH (ref 70–99)
Potassium: 3.8 mmol/L (ref 3.5–5.1)
Sodium: 140 mmol/L (ref 135–145)
Total Bilirubin: 1.6 mg/dL — ABNORMAL HIGH (ref 0.3–1.2)
Total Protein: 6.8 g/dL (ref 6.5–8.1)

## 2021-09-17 LAB — CBC
HCT: 44.4 % (ref 39.0–52.0)
Hemoglobin: 15 g/dL (ref 13.0–17.0)
MCH: 30.1 pg (ref 26.0–34.0)
MCHC: 33.8 g/dL (ref 30.0–36.0)
MCV: 89.2 fL (ref 80.0–100.0)
Platelets: 304 10*3/uL (ref 150–400)
RBC: 4.98 MIL/uL (ref 4.22–5.81)
RDW: 12.6 % (ref 11.5–15.5)
WBC: 6.5 10*3/uL (ref 4.0–10.5)
nRBC: 0 % (ref 0.0–0.2)

## 2021-09-17 LAB — URINALYSIS, ROUTINE W REFLEX MICROSCOPIC
Bacteria, UA: NONE SEEN
Bilirubin Urine: NEGATIVE
Glucose, UA: NEGATIVE mg/dL
Hgb urine dipstick: NEGATIVE
Ketones, ur: NEGATIVE mg/dL
Leukocytes,Ua: NEGATIVE
Nitrite: NEGATIVE
Protein, ur: 100 mg/dL — AB
Specific Gravity, Urine: 1.023 (ref 1.005–1.030)
pH: 6 (ref 5.0–8.0)

## 2021-09-17 LAB — LIPASE, BLOOD: Lipase: 41 U/L (ref 11–51)

## 2021-09-17 NOTE — ED Triage Notes (Signed)
Pt arrives POV for eval of LLQ abd pain which now radiates to L flank. Pt reports normal BM for him this AM. Denies N/V, denies radiation into testes, denies urinary sx. States time of onset 1 hour PTA, Denies hx of similar pain.

## 2021-09-17 NOTE — ED Notes (Signed)
The patient did not answer for room x2

## 2021-09-17 NOTE — ED Provider Triage Note (Signed)
Emergency Medicine Provider Triage Evaluation Note  Tyrian Peart , a 51 y.o. male  was evaluated in triage.  Pt complains of left flank pain started 1 hour PTO.   States sudden onset of abd pain.   No nvd. No blood in stool. No hx of ureterolith.  Hx of etoh use  Review of Systems  Positive: Abd pain Negative: Fever   Physical Exam  BP (!) 137/108 (BP Location: Right Arm)   Pulse 90   Temp 98.5 F (36.9 C) (Oral)   Resp 16   Ht 5\' 9"  (1.753 m)   Wt 106 kg   SpO2 96%   BMI 34.51 kg/m  Gen:   Awake, no distress   Resp:  Normal effort  MSK:   Moves extremities without difficulty  Other:  L CVAT, abd soft NTTP  Medical Decision Making  Medically screening exam initiated at 11:26 AM.  Appropriate orders placed.  Akiem Urieta was informed that the remainder of the evaluation will be completed by another provider, this initial triage assessment does not replace that evaluation, and the importance of remaining in the ED until their evaluation is complete.  Labs, CT   Lucrezia Europe Haskell, DOLE 09/17/21 1128

## 2021-10-15 ENCOUNTER — Ambulatory Visit: Payer: 59

## 2021-10-15 DIAGNOSIS — R002 Palpitations: Secondary | ICD-10-CM

## 2021-10-15 DIAGNOSIS — R0602 Shortness of breath: Secondary | ICD-10-CM

## 2021-10-29 ENCOUNTER — Ambulatory Visit: Payer: 59 | Admitting: Cardiology

## 2022-07-07 ENCOUNTER — Other Ambulatory Visit: Payer: 59

## 2023-01-28 ENCOUNTER — Emergency Department (HOSPITAL_BASED_OUTPATIENT_CLINIC_OR_DEPARTMENT_OTHER): Payer: 59 | Admitting: Radiology

## 2023-01-28 ENCOUNTER — Emergency Department (HOSPITAL_BASED_OUTPATIENT_CLINIC_OR_DEPARTMENT_OTHER): Payer: 59

## 2023-01-28 ENCOUNTER — Other Ambulatory Visit: Payer: Self-pay

## 2023-01-28 ENCOUNTER — Encounter (HOSPITAL_BASED_OUTPATIENT_CLINIC_OR_DEPARTMENT_OTHER): Payer: Self-pay | Admitting: Emergency Medicine

## 2023-01-28 ENCOUNTER — Telehealth (HOSPITAL_BASED_OUTPATIENT_CLINIC_OR_DEPARTMENT_OTHER): Payer: Self-pay | Admitting: Emergency Medicine

## 2023-01-28 ENCOUNTER — Emergency Department (HOSPITAL_BASED_OUTPATIENT_CLINIC_OR_DEPARTMENT_OTHER)
Admission: EM | Admit: 2023-01-28 | Discharge: 2023-01-28 | Disposition: A | Discharge status: Home / Self Care | Payer: 59 | Attending: Emergency Medicine | Admitting: Emergency Medicine

## 2023-01-28 DIAGNOSIS — Z79899 Other long term (current) drug therapy: Secondary | ICD-10-CM | POA: Diagnosis not present

## 2023-01-28 DIAGNOSIS — R0789 Other chest pain: Secondary | ICD-10-CM | POA: Diagnosis not present

## 2023-01-28 DIAGNOSIS — E876 Hypokalemia: Secondary | ICD-10-CM | POA: Diagnosis not present

## 2023-01-28 DIAGNOSIS — R1012 Left upper quadrant pain: Secondary | ICD-10-CM | POA: Diagnosis present

## 2023-01-28 LAB — URINALYSIS, ROUTINE W REFLEX MICROSCOPIC
Bacteria, UA: NONE SEEN
Bilirubin Urine: NEGATIVE
Glucose, UA: NEGATIVE mg/dL
Hgb urine dipstick: NEGATIVE
Ketones, ur: NEGATIVE mg/dL
Leukocytes,Ua: NEGATIVE
Nitrite: NEGATIVE
Protein, ur: 30 mg/dL — AB
Specific Gravity, Urine: 1.021 (ref 1.005–1.030)
pH: 6 (ref 5.0–8.0)

## 2023-01-28 LAB — COMPREHENSIVE METABOLIC PANEL
ALT: 34 U/L (ref 0–44)
AST: 44 U/L — ABNORMAL HIGH (ref 15–41)
Albumin: 4.8 g/dL (ref 3.5–5.0)
Alkaline Phosphatase: 39 U/L (ref 38–126)
Anion gap: 12 (ref 5–15)
BUN: 9 mg/dL (ref 6–20)
CO2: 26 mmol/L (ref 22–32)
Calcium: 10.4 mg/dL — ABNORMAL HIGH (ref 8.9–10.3)
Chloride: 98 mmol/L (ref 98–111)
Creatinine, Ser: 1.14 mg/dL (ref 0.61–1.24)
GFR, Estimated: >60 mL/min (ref >60)
Glucose, Bld: 113 mg/dL — ABNORMAL HIGH (ref 70–99)
Potassium: 3.1 mmol/L — ABNORMAL LOW (ref 3.5–5.1)
Sodium: 136 mmol/L (ref 135–145)
Total Bilirubin: 0.7 mg/dL (ref <1.2)
Total Protein: 7.9 g/dL (ref 6.5–8.1)

## 2023-01-28 LAB — CBC
HCT: 43.3 % (ref 39.0–52.0)
Hemoglobin: 15.5 g/dL (ref 13.0–17.0)
MCH: 30.6 pg (ref 26.0–34.0)
MCHC: 35.8 g/dL (ref 30.0–36.0)
MCV: 85.4 fL (ref 80.0–100.0)
Platelets: 277 10*3/uL (ref 150–400)
RBC: 5.07 MIL/uL (ref 4.22–5.81)
RDW: 13.3 % (ref 11.5–15.5)
WBC: 8.3 10*3/uL (ref 4.0–10.5)
nRBC: 0 % (ref 0.0–0.2)

## 2023-01-28 LAB — LIPASE, BLOOD: Lipase: 33 U/L (ref 11–51)

## 2023-01-28 MED ORDER — POTASSIUM CHLORIDE ER 10 MEQ PO TBCR: 10.0000 meq | EXTENDED_RELEASE_TABLET | Freq: Every day | ORAL | 0 refills | Status: DC

## 2023-01-28 MED ORDER — OXYCODONE-ACETAMINOPHEN 5-325 MG PO TABS
1.0000 | ORAL_TABLET | Freq: Once | ORAL | Status: AC
  Administered 2023-01-28: 1 via ORAL
  Filled 2023-01-28: qty 1

## 2023-01-28 MED ORDER — OXYCODONE-ACETAMINOPHEN 5-325 MG PO TABS: 1.0000 | ORAL_TABLET | Freq: Three times a day (TID) | ORAL | 0 refills | Status: DC | PRN

## 2023-01-28 MED ORDER — IBUPROFEN 600 MG PO TABS: 600.0000 mg | ORAL_TABLET | Freq: Four times a day (QID) | ORAL | 0 refills | Status: AC | PRN

## 2023-01-28 MED ORDER — POTASSIUM CHLORIDE CRYS ER 20 MEQ PO TBCR
40.0000 meq | EXTENDED_RELEASE_TABLET | Freq: Once | ORAL | Status: AC
  Administered 2023-01-28: 40 meq via ORAL
  Filled 2023-01-28: qty 2

## 2023-01-28 MED ORDER — IOHEXOL 300 MG/ML  SOLN
100.0000 mL | Freq: Once | INTRAMUSCULAR | Status: AC | PRN
  Administered 2023-01-28: 100 mL via INTRAVENOUS

## 2023-01-28 MED ORDER — ALUM & MAG HYDROXIDE-SIMETH 200-200-20 MG/5ML PO SUSP
30.0000 mL | Freq: Once | ORAL | Status: AC
  Administered 2023-01-28: 30 mL via ORAL
  Filled 2023-01-28: qty 30

## 2023-01-28 NOTE — Telephone Encounter (Signed)
Patient requesting prescriptions be sent to different pharmacy, which has been changed to [facility] on [address].

## 2023-01-28 NOTE — Discharge Instructions (Signed)
It is possible that you have a broken bone or rib fracture.  Your x-ray and CT scan did not show any medical emergencies.  Your potassium level is mildly low and you are prescribed potassium.  Please follow-up with your primary care doctor in the office.

## 2023-01-28 NOTE — ED Triage Notes (Signed)
Pt POV reporting LUQ abd pain that began last night and worsened after eating this morning. Also reports daily alcohol intake, about 10 tequila shots a day.

## 2023-01-28 NOTE — ED Provider Notes (Signed)
[name] [facility] AT [facility] Provider Note   CSN: [id number] Arrival date & time: 01/28/23  1819     History  Chief Complaint  Patient presents with   Abdominal Pain    Warren [name] is a 52 y.o. male presenting to ED with abdominal pain, onset last night gradually, worsening this morning.  Patient orts that he had several episodes of vomiting earlier yesterday which she attributes to "drinking too much alcohol".  He says began having left upper abdominal pain after that which has been persistent overnight.  It is sharp, stabbing, reproducible to palpation.  He has never had this pain before.  He reports nausea, denies vomiting.  Denies diarrhea.  Reports some constipation on and off this week.       Home Medications Prior to Admission medications   Medication Sig Start Date End Date Taking? Authorizing Provider  ibuprofen (ADVIL) 600 MG tablet Take 1 tablet (600 mg total) by mouth every 6 (six) hours as needed. 01/28/23  Yes Terald Sleeper, MD  allopurinol (ZYLOPRIM) 100 MG tablet Take 100 mg by mouth daily. 06/09/21   [provider]  amLODipine-benazepril (LOTREL) 10-20 MG capsule Take 1 capsule by mouth daily. 07/05/21   [provider]  atorvastatin (LIPITOR) 10 MG tablet Take 10 mg by mouth daily. 06/01/21   [provider]  hydrochlorothiazide (HYDRODIURIL) 12.5 MG tablet Take 2 tablets (25 mg total) by mouth daily. 05/09/12   Jimmie Molly, MD  oxyCODONE-acetaminophen (PERCOCET/ROXICET) 5-325 MG tablet Take 1 tablet by mouth every 8 (eight) hours as needed for up to 10 doses for severe pain (pain score 7-10). 01/28/23   Terald Sleeper, MD  pantoprazole (PROTONIX) 40 MG tablet Take 40 mg by mouth daily. 06/28/21   [provider]  potassium chloride (KLOR-CON) 10 MEQ tablet Take 1 tablet (10 mEq total) by mouth daily for 10 days. 01/28/23 02/07/23  Terald Sleeper, MD      Allergies    Patient has no known  allergies.    Review of Systems   Review of Systems  Physical Exam Updated Vital Signs BP 112/89   Pulse 80   Temp 98.2 F (36.8 C) (Oral)   Resp 14   Ht 5\' 9"  (1.753 m)   Wt 112.9 kg   SpO2 96%   BMI 36.77 kg/m  Physical Exam Constitutional:      General: He is not in acute distress. HENT:     Head: Normocephalic and atraumatic.  Eyes:     Conjunctiva/sclera: Conjunctivae normal.     Pupils: Pupils are equal, round, and reactive to light.  Cardiovascular:     Rate and Rhythm: Normal rate and regular rhythm.  Pulmonary:     Effort: Pulmonary effort is normal. No respiratory distress.  Abdominal:     General: There is no distension.     Tenderness: There is abdominal tenderness in the left upper quadrant.  Musculoskeletal:     Comments: Left anterior medial rib tenderness  Skin:    General: Skin is warm and dry.  Neurological:     General: No focal deficit present.     Mental Status: He is alert. Mental status is at baseline.  Psychiatric:        Mood and Affect: Mood normal.        Behavior: Behavior normal.     ED Results / Procedures / Treatments   Labs (all labs ordered are listed, but only abnormal results are displayed)  Labs Reviewed  COMPREHENSIVE METABOLIC PANEL - Abnormal; Notable for the following components:      Result Value   Potassium 3.1 (*)    Glucose, Bld 113 (*)    Calcium 10.4 (*)    AST 44 (*)    All other components within normal limits  URINALYSIS, ROUTINE W REFLEX MICROSCOPIC - Abnormal; Notable for the following components:   Protein, ur 30 (*)    All other components within normal limits  LIPASE, BLOOD  CBC    EKG EKG Interpretation Date/Time:  Saturday January 28 2023 21:19:55 EST Ventricular Rate:  73 PR Interval:  181 QRS Duration:  89 QT Interval:  394 QTC Calculation: 435 R Axis:   111  Text Interpretation: Sinus rhythm Right axis deviation Confirmed by Alvester Chou [phone]) on 01/28/2023 9:28:21  PM  Radiology DG Ribs Unilateral W/Chest Left  Result Date: 01/28/2023 CLINICAL DATA:  Rib pain EXAM: LEFT RIBS AND CHEST - 3+ VIEW COMPARISON:  Chest x-ray 06/30/2021 FINDINGS: No fracture or other bone lesions are seen involving the ribs. There is no evidence of pneumothorax or pleural effusion. Both lungs are clear. Heart size and mediastinal contours are within normal limits. IMPRESSION: Negative. Electronically Signed   By: Darliss Cheney M.D.   On: 01/28/2023 21:32   CT ABDOMEN PELVIS W CONTRAST  Result Date: 01/28/2023 CLINICAL DATA:  Left upper quadrant pain EXAM: CT ABDOMEN AND PELVIS WITH CONTRAST TECHNIQUE: Multidetector CT imaging of the abdomen and pelvis was performed using the standard protocol following bolus administration of intravenous contrast. RADIATION DOSE REDUCTION: This exam was performed according to the departmental dose-optimization program which includes automated exposure control, adjustment of the mA and/or kV according to patient size and/or use of iterative reconstruction technique. CONTRAST:  OMNIPAQUE IOHEXOL 300 MG/ML  SOLN COMPARISON:  CT abdomen and pelvis 09/17/2021 FINDINGS: Lower chest: No acute abnormality. Hepatobiliary: No focal liver abnormality is seen. No gallstones, gallbladder wall thickening, or biliary dilatation. Pancreas: Unremarkable. No pancreatic ductal dilatation or surrounding inflammatory changes. Spleen: Normal in size without focal abnormality. Adrenals/Urinary Tract: Adrenal glands are unremarkable. There is a subcentimeter cyst in the left kidney. Kidneys are otherwise normal, without renal calculi, focal lesion, or hydronephrosis. Bladder is unremarkable. Stomach/Bowel: Stomach is within normal limits. Appendix appears normal. No evidence of bowel wall thickening, distention, or inflammatory changes. Vascular/Lymphatic: No significant vascular findings are present. No enlarged abdominal or pelvic lymph nodes. Reproductive: Prostate is  unremarkable. Other: No abdominal wall hernia or abnormality. No abdominopelvic ascites. Musculoskeletal: There are degenerative changes at L5-S1. IMPRESSION: 1. No acute localizing process in the abdomen or pelvis. 2. Subcentimeter left renal cyst. No follow-up imaging recommended. Electronically Signed   By: Darliss Cheney M.D.   On: 01/28/2023 21:24    Procedures Procedures    Medications Ordered in ED Medications  potassium chloride SA (KLOR-CON M) CR tablet 40 mEq (40 mEq Oral Given 01/28/23 2032)  alum & mag hydroxide-simeth (MAALOX/MYLANTA) 200-200-20 MG/5ML suspension 30 mL (30 mLs Oral Given 01/28/23 2032)  iohexol (OMNIPAQUE) 300 MG/ML solution 100 mL (100 mLs Intravenous Contrast Given 01/28/23 2038)  oxyCODONE-acetaminophen (PERCOCET/ROXICET) 5-325 MG per tablet 1 tablet (1 tablet Oral Given 01/28/23 2236)    ED Course/ Medical Decision Making/ A&P                                 Medical Decision Making Amount and/or Complexity of Data Reviewed Labs:  ordered. Radiology: ordered. ECG/medicine tests: ordered.  Risk OTC drugs. Prescription drug management.   This patient presents to the ED with concern for left upper abdominal pain and left lower chest wall tenderness. This involves an extensive number of treatment options, and is a complaint that carries with it a high risk of complications and morbidity.  The differential diagnosis includes gastritis versus colitis versus rib fracture versus lower lobe pneumonia versus other  I ordered and personally interpreted labs.  The pertinent results include: Mild hypokalemia (patient reports this is chronic), no other emergent findings  I ordered imaging studies including rib series x-ray of the chest and CT abdomen pelvis I independently visualized and interpreted imaging which showed no emergent findings I agree with the radiologist interpretation   I ordered medication including potassium hypokalemia, Percocet for pain, Maalox  for suspected gastritis  I have reviewed the patients home medicines and have made adjustments as needed  Test Considered: Low suspicion for acute PE, aortic dissection, mesenteric ischemia.  Patient's pain is easily reproducible to palpation and I have a low suspicion for ACS with his presentation   After the interventions noted above, I reevaluated the patient and found that they have: improved  Social Determinants of Health: counseled on alcohol moderation  Dispostion:  After consideration of the diagnostic results and the patients response to treatment, I feel that the patent would benefit from outpatient follow-up.  We discussed the possibility of a rib fracture that is poorly visualized on x-ray imaging.  We will manage this conservatively in the meantime.  He verbalized understanding and his wife will be taking him home.         Final Clinical Impression(s) / ED Diagnoses Final diagnoses:  Hypokalemia  Left-sided chest wall pain    Rx / DC Orders ED Discharge Orders          Ordered    oxyCODONE-acetaminophen (PERCOCET/ROXICET) 5-325 MG tablet  Every 8 hours PRN,   Status:  Discontinued        01/28/23 2221    ibuprofen (ADVIL) 600 MG tablet  Every 6 hours PRN        01/28/23 2221    potassium chloride (KLOR-CON) 10 MEQ tablet  Daily,   Status:  Discontinued        01/28/23 2221              Terald Sleeper, MD 01/28/23 2326

## 2023-03-23 ENCOUNTER — Emergency Department (HOSPITAL_BASED_OUTPATIENT_CLINIC_OR_DEPARTMENT_OTHER)
Admission: EM | Admit: 2023-03-23 | Discharge: 2023-03-23 | Disposition: A | Discharge status: Home / Self Care | Payer: 59 | Source: Home / Self Care | Attending: Emergency Medicine | Admitting: Emergency Medicine

## 2023-03-23 ENCOUNTER — Encounter (HOSPITAL_BASED_OUTPATIENT_CLINIC_OR_DEPARTMENT_OTHER): Payer: Self-pay | Admitting: Emergency Medicine

## 2023-03-23 ENCOUNTER — Inpatient Hospital Stay (HOSPITAL_BASED_OUTPATIENT_CLINIC_OR_DEPARTMENT_OTHER)
Admission: EM | Admit: 2023-03-23 | Discharge: 2023-03-25 | DRG: 897 | Disposition: A | Discharge status: Home / Self Care | Payer: 59 | Attending: Internal Medicine | Admitting: Internal Medicine

## 2023-03-23 ENCOUNTER — Emergency Department (HOSPITAL_BASED_OUTPATIENT_CLINIC_OR_DEPARTMENT_OTHER): Payer: 59 | Admitting: Radiology

## 2023-03-23 ENCOUNTER — Other Ambulatory Visit: Payer: Self-pay

## 2023-03-23 DIAGNOSIS — Z79899 Other long term (current) drug therapy: Secondary | ICD-10-CM | POA: Insufficient documentation

## 2023-03-23 DIAGNOSIS — F10939 Alcohol use, unspecified with withdrawal, unspecified: Secondary | ICD-10-CM | POA: Diagnosis not present

## 2023-03-23 DIAGNOSIS — Z83438 Family history of other disorder of lipoprotein metabolism and other lipidemia: Secondary | ICD-10-CM

## 2023-03-23 DIAGNOSIS — I10 Essential (primary) hypertension: Secondary | ICD-10-CM | POA: Diagnosis present

## 2023-03-23 DIAGNOSIS — Z833 Family history of diabetes mellitus: Secondary | ICD-10-CM

## 2023-03-23 DIAGNOSIS — Y900 Blood alcohol level of less than 20 mg/100 ml: Secondary | ICD-10-CM | POA: Diagnosis present

## 2023-03-23 DIAGNOSIS — K219 Gastro-esophageal reflux disease without esophagitis: Secondary | ICD-10-CM | POA: Diagnosis present

## 2023-03-23 DIAGNOSIS — E785 Hyperlipidemia, unspecified: Secondary | ICD-10-CM | POA: Diagnosis present

## 2023-03-23 DIAGNOSIS — R7401 Elevation of levels of liver transaminase levels: Secondary | ICD-10-CM | POA: Diagnosis present

## 2023-03-23 DIAGNOSIS — E860 Dehydration: Secondary | ICD-10-CM | POA: Diagnosis present

## 2023-03-23 DIAGNOSIS — F10230 Alcohol dependence with withdrawal, uncomplicated: Secondary | ICD-10-CM | POA: Insufficient documentation

## 2023-03-23 DIAGNOSIS — E8729 Other acidosis: Secondary | ICD-10-CM | POA: Diagnosis present

## 2023-03-23 DIAGNOSIS — E872 Acidosis, unspecified: Secondary | ICD-10-CM | POA: Diagnosis present

## 2023-03-23 DIAGNOSIS — E876 Hypokalemia: Secondary | ICD-10-CM | POA: Diagnosis present

## 2023-03-23 DIAGNOSIS — F10930 Alcohol use, unspecified with withdrawal, uncomplicated: Secondary | ICD-10-CM

## 2023-03-23 DIAGNOSIS — M109 Gout, unspecified: Secondary | ICD-10-CM | POA: Diagnosis present

## 2023-03-23 DIAGNOSIS — F1010 Alcohol abuse, uncomplicated: Secondary | ICD-10-CM | POA: Diagnosis present

## 2023-03-23 DIAGNOSIS — F419 Anxiety disorder, unspecified: Secondary | ICD-10-CM | POA: Diagnosis present

## 2023-03-23 HISTORY — DX: Alcohol abuse, uncomplicated: F10.10

## 2023-03-23 HISTORY — DX: Gastro-esophageal reflux disease without esophagitis: K21.9

## 2023-03-23 LAB — CBC WITH DIFFERENTIAL/PLATELET
Abs Immature Granulocytes: 0.06 10*3/uL (ref 0.00–0.07)
Basophils Absolute: 0.1 10*3/uL (ref 0.0–0.1)
Basophils Relative: 1 %
Eosinophils Absolute: 0.1 10*3/uL (ref 0.0–0.5)
Eosinophils Relative: 1 %
HCT: 45 % (ref 39.0–52.0)
Hemoglobin: 16 g/dL (ref 13.0–17.0)
Immature Granulocytes: 1 %
Lymphocytes Relative: 54 %
Lymphs Abs: 3.9 10*3/uL (ref 0.7–4.0)
MCH: 31 pg (ref 26.0–34.0)
MCHC: 35.6 g/dL (ref 30.0–36.0)
MCV: 87.2 fL (ref 80.0–100.0)
Monocytes Absolute: 0.7 10*3/uL (ref 0.1–1.0)
Monocytes Relative: 9 %
Neutro Abs: 2.4 10*3/uL (ref 1.7–7.7)
Neutrophils Relative %: 34 %
Platelets: 315 10*3/uL (ref 150–400)
RBC: 5.16 MIL/uL (ref 4.22–5.81)
RDW: 13.3 % (ref 11.5–15.5)
WBC: 7.3 10*3/uL (ref 4.0–10.5)
nRBC: 0 % (ref 0.0–0.2)

## 2023-03-23 LAB — COMPREHENSIVE METABOLIC PANEL
ALT: 80 U/L — ABNORMAL HIGH (ref 0–44)
AST: 99 U/L — ABNORMAL HIGH (ref 15–41)
Albumin: 4.5 g/dL (ref 3.5–5.0)
Alkaline Phosphatase: 40 U/L (ref 38–126)
Anion gap: 22 — ABNORMAL HIGH (ref 5–15)
BUN: 12 mg/dL (ref 6–20)
CO2: 21 mmol/L — ABNORMAL LOW (ref 22–32)
Calcium: 9.2 mg/dL (ref 8.9–10.3)
Chloride: 94 mmol/L — ABNORMAL LOW (ref 98–111)
Creatinine, Ser: 1.09 mg/dL (ref 0.61–1.24)
GFR, Estimated: >60 mL/min (ref >60)
Glucose, Bld: 131 mg/dL — ABNORMAL HIGH (ref 70–99)
Potassium: 3.2 mmol/L — ABNORMAL LOW (ref 3.5–5.1)
Sodium: 137 mmol/L (ref 135–145)
Total Bilirubin: 1.1 mg/dL (ref 0.0–1.2)
Total Protein: 7.5 g/dL (ref 6.5–8.1)

## 2023-03-23 LAB — CBG MONITORING, ED: Glucose-Capillary: 130 mg/dL — ABNORMAL HIGH (ref 70–99)

## 2023-03-23 LAB — MAGNESIUM: Magnesium: 1.7 mg/dL (ref 1.7–2.4)

## 2023-03-23 LAB — TROPONIN I (HIGH SENSITIVITY): Troponin I (High Sensitivity): 5 ng/L (ref <18)

## 2023-03-23 MED ORDER — ONDANSETRON HCL 4 MG/2ML IJ SOLN
4.0000 mg | Freq: Once | INTRAMUSCULAR | Status: AC
  Administered 2023-03-23: 4 mg via INTRAVENOUS
  Filled 2023-03-23: qty 2

## 2023-03-23 MED ORDER — PHENOBARBITAL SODIUM 65 MG/ML IJ SOLN
65.0000 mg | Freq: Once | INTRAMUSCULAR | Status: AC
  Administered 2023-03-23: 65 mg via INTRAVENOUS
  Filled 2023-03-23: qty 1

## 2023-03-23 MED ORDER — PHENOBARBITAL SODIUM 65 MG/ML IJ SOLN: 130.0000 mg | Freq: Once | INTRAMUSCULAR | Status: DC

## 2023-03-23 MED ORDER — PHENOBARBITAL SODIUM 65 MG/ML IJ SOLN
130.0000 mg | Freq: Once | INTRAMUSCULAR | Status: AC
  Administered 2023-03-23: 130 mg via INTRAVENOUS
  Filled 2023-03-23: qty 2

## 2023-03-23 MED ORDER — CHLORDIAZEPOXIDE HCL 25 MG PO CAPS: ORAL_CAPSULE | ORAL | 0 refills | Status: AC

## 2023-03-23 NOTE — ED Provider Notes (Addendum)
 [place] EMERGENCY DEPARTMENT AT [facility] Provider Note   CSN: [id number] Arrival date & time: 03/23/23  1216     History  Chief Complaint  Patient presents with   Near Syncope    Warren [name] is a 53 y.o. male.   Near Syncope  Patient presents to the ED complaining of near syncope that happened this afternoon while he was sitting on his couch.  This is not happened before.  Previous medical history of hypertension, GI bleed, ulcer, gout.  Endorses alcohol use every day amounting to 1/5 of vodka or tequila.  He has not drank since last night.  States that now his hands are shaking, experiencing vertigo/blurry vision, and 1 episode of nausea, vomiting.  Is not currently taking any vitamins.  Denies headache, fever, chest pain, shortness of breath, abdominal pain, numbness, tingling, constipation, diarrhea, hematochezia, melena.     Home Medications Prior to Admission medications   Medication Sig Start Date End Date Taking? Authorizing Provider  allopurinol  (ZYLOPRIM ) 100 MG tablet Take 100 mg by mouth daily. 06/09/21  Yes [provider]  amLODipine -benazepril (LOTREL) 10-20 MG capsule Take 1 capsule by mouth daily. 07/05/21  Yes [provider]  atorvastatin (LIPITOR) 10 MG tablet Take 10 mg by mouth daily. 06/01/21  Yes [provider]  chlordiazePOXIDE  (LIBRIUM ) 25 MG capsule 50mg  PO TID x 1D, then 25-50mg  PO BID X 1D, then 25-50mg  PO QD X 1D 03/23/23  Yes Mannie Pac T, DO  hydrochlorothiazide  (HYDRODIURIL ) 12.5 MG tablet Take 2 tablets (25 mg total) by mouth daily. 05/09/12  Yes Penne Noe, MD  ibuprofen  (ADVIL ) 600 MG tablet Take 1 tablet (600 mg total) by mouth every 6 (six) hours as needed. 01/28/23  Yes Trifan, Donnice PARAS, MD  pantoprazole  (PROTONIX ) 40 MG tablet Take 40 mg by mouth daily. 06/28/21  Yes [provider]  oxyCODONE -acetaminophen  (PERCOCET/ROXICET) 5-325 MG tablet Take 1 tablet by mouth every 8 (eight) hours as  needed for up to 10 doses for severe pain (pain score 7-10). Patient not taking: Reported on 03/23/2023 01/28/23   Cottie Donnice PARAS, MD      Allergies    Patient has no known allergies.    Review of Systems   Review of Systems  Cardiovascular:  Positive for near-syncope.  Neurological:  Positive for tremors and weakness.    Physical Exam Updated Vital Signs BP 114/83   Pulse (!) 107   Temp 98.3 F (36.8 C) (Oral)   Resp 17   Ht 5' 9 (1.753 m)   Wt 112 kg   SpO2 94%   BMI 36.48 kg/m  Physical Exam Vitals and nursing note reviewed.  Constitutional:      Appearance: Normal appearance.  HENT:     Head: Normocephalic and atraumatic.     Right Ear: Tympanic membrane, ear canal and external ear normal. There is no impacted cerumen.     Left Ear: Tympanic membrane, ear canal and external ear normal. There is no impacted cerumen.     Mouth/Throat:     Mouth: Mucous membranes are moist.     Pharynx: Oropharynx is clear.     Comments: Tongue fasciculations noted Eyes:     General:        Right eye: No discharge.        Left eye: No discharge.     Extraocular Movements: Extraocular movements intact.     Conjunctiva/sclera: Conjunctivae normal.     Pupils: Pupils are equal, round, and reactive  to light.     Comments: No nystagmus present  Cardiovascular:     Rate and Rhythm: Regular rhythm. Tachycardia present.     Pulses: Normal pulses.     Heart sounds: Normal heart sounds. No murmur heard.    No friction rub. No gallop.  Pulmonary:     Effort: Pulmonary effort is normal. No respiratory distress.     Breath sounds: Normal breath sounds. No stridor. No wheezing or rales.  Abdominal:     General: Abdomen is flat. There is no distension.     Palpations: Abdomen is soft.     Tenderness: There is no abdominal tenderness.  Musculoskeletal:     Cervical back: No rigidity.  Skin:    General: Skin is warm and dry.     Capillary Refill: Capillary refill takes less than 2  seconds.  Neurological:     Mental Status: He is alert and oriented to person, place, and time.     Comments: Normal coordination, negative hand drift.   Bilateral hand tremors noted  Able to move all 4 extremities equally  Psychiatric:        Mood and Affect: Mood normal.     ED Results / Procedures / Treatments   Labs (all labs ordered are listed, but only abnormal results are displayed) Labs Reviewed  COMPREHENSIVE METABOLIC PANEL - Abnormal; Notable for the following components:      Result Value   Potassium 3.2 (*)    Chloride 94 (*)    CO2 21 (*)    Glucose, Bld 131 (*)    AST 99 (*)    ALT 80 (*)    Anion gap 22 (*)    All other components within normal limits  CBG MONITORING, ED - Abnormal; Notable for the following components:   Glucose-Capillary 130 (*)    All other components within normal limits  CBC WITH DIFFERENTIAL/PLATELET  MAGNESIUM   TROPONIN I (HIGH SENSITIVITY)  TROPONIN I (HIGH SENSITIVITY)    EKG EKG Interpretation Date/Time:  Thursday March 23 2023 12:33:23 EST Ventricular Rate:  121 PR Interval:  174 QRS Duration:  74 QT Interval:  304 QTC Calculation: 431 R Axis:   56  Text Interpretation: Sinus tachycardia Nonspecific ST abnormality Abnormal ECG When compared with ECG of 28-Jan-2023 21:19, PREVIOUS ECG IS PRESENT Confirmed by Mannie Pac [phone]) on 03/23/2023 1:20:20 PM  Radiology DG Chest 2 View Result Date: 03/23/2023 CLINICAL DATA:  Tachycardia EXAM: CHEST - 2 VIEW COMPARISON:  X-ray 01/28/2023.  Older exams as well. FINDINGS: No consolidation, pneumothorax or effusion. No edema. Normal cardiopericardial silhouette. Overlapping cardiac leads. IMPRESSION: No acute cardiopulmonary disease. Electronically Signed   By: Ranell Bring M.D.   On: 03/23/2023 16:04    Procedures Procedures    Medications Ordered in ED Medications  PHENObarbital  (LUMINAL) injection 65 mg (65 mg Intravenous Given 03/23/23 1328)  ondansetron  (ZOFRAN )  injection 4 mg (4 mg Intravenous Given 03/23/23 1327)  PHENObarbital  (LUMINAL) injection 65 mg (65 mg Intravenous Given 03/23/23 1409)    ED Course/ Medical Decision Making/ A&P Clinical Course as of 03/23/23 1705  Thu Mar 23, 2023  1558 Temp: 97.9 F (36.6 C) [CB]  1626 DG Chest 2 View [CB]    Clinical Course User Index [CB] Beola Terrall RAMAN, [place]             HEART Score: 3                    Medical Decision  Making Amount and/or Complexity of Data Reviewed Labs: ordered. Radiology: ordered. Decision-making details documented in ED Course.  Risk Prescription drug management.   This patient is a 53 year old male who presents to the ED for concern of near syncope, tremors, tachycardia.   Differential diagnoses prior to evaluation: The emergent differential diagnosis includes, but is not limited to, alcohol withdrawal, beriberi, Warnicke/Korsakoff encephalopathy, CVA, trauma, drug toxicity. This is not an exhaustive differential.   Past Medical History / Co-morbidities / Social History: Endorses daily alcohol use of 1/5 of tequila or vodka since 2006.  Denies any other drug use.  Previous medical history of hypertension, ulcer, GI bleed, gout.  Additional history: Chart reviewed. Pertinent results include:   Previous seen previously seen on 01/28/2023 for hypokalemia with left upper quadrant aminal pain.  Lab Tests/Imaging studies: I personally interpreted labs/imaging and the pertinent results include:   Magnesium  unremarkable Troponin unremarkable CBC unremarkable CMP shows mild hypokalemia, and mildly elevated liver enzymes with AST being 99 and ALT 80, anion gap of 22  Chest x-ray unremarkable  I agree with the radiologist interpretation.  Cardiac monitoring: EKG obtained and interpreted by myself and attending physician which shows:   EKG Interpretation Date/Time:  Thursday March 23 2023 12:33:23 EST Ventricular Rate:  121 PR Interval:  174 QRS  Duration:  74 QT Interval:  304 QTC Calculation: 431 R Axis:   56  Text Interpretation: Sinus tachycardia Nonspecific ST abnormality Abnormal ECG When compared with ECG of 28-Jan-2023 21:19, PREVIOUS ECG IS PRESENT Confirmed by Mannie Pac [phone]) on 03/23/2023 1:20:20 PM          Medications: I ordered medication including phenobarbital  and Zofran .  I have reviewed the patients home medicines and have made adjustments as needed.  Critical Interventions:  ED Course:  Patient is 53 year old male presents the ED with complaints of near syncope and tremors that have started since this afternoon.  Previous medical history of gout, GI bleed, ulcer, hypertension.  Had stated to his PCP that he wanted to quit alcohol and was prescribed naltrexone.  He had taken it this morning.  Since then, he has experienced hand tremors, tongue fasciculations, 1 episode of nausea and vomiting, blurry vision, vertigo.  Denies AV hallucinations, seizures, fever, chest pain, shortness of breath, abdominal pain, diarrhea.  On physical exam tongue fasciculations, hand tremors were noted.  Due to presentation and history, this appears to be alcohol withdrawal.  Full workup was done to ensure that there were no cardiac, infectious, metabolic complications.  Phenobarbital  was provided and patient responded well.  Zofran  was also provided for nausea.  Patient tolerated p.o. intake.  Discussed case with attending.  He agrees that he would be a good candidate for going home with Librium  taper considering that his labs and vital signs were stable and patient improved with treatment.  Patient denies SI.  Patient expressed desire to continue to stop his alcohol intake.  Provided strict instructions on how to take Librium  and also strict return to ER precautions.  Patient expressed understanding and agreement with plan.  Also told patient to follow-up with PCP as soon as possible to begin monitoring him as he quits alcohol, as  well as to follow-up with elevated liver enzymes.   Disposition: After consideration of the diagnostic results and the patients response to treatment, I feel that patient benefit from discharge treatment as above.   emergency department workup does not suggest an emergent condition requiring admission or immediate intervention beyond what has been performed  at this time. The plan is: Librium , follow-up PCP, return to ER for new or returning symptoms. The patient is safe for discharge and has been instructed to return immediately for worsening symptoms, change in symptoms or any other concerns.   Final Clinical Impression(s) / ED Diagnoses Final diagnoses:  Alcohol withdrawal syndrome without complication [facility])    Rx / DC Orders ED Discharge Orders          Ordered    chlordiazePOXIDE  (LIBRIUM ) 25 MG capsule        03/23/23 1500              Beola Terrall RAMAN, PA-C 03/23/23 1705    Beola Terrall RAMAN, PA-C 03/23/23 1726    Mannie Pac T, DO 03/24/23 [phone]

## 2023-03-23 NOTE — ED Provider Notes (Signed)
 [place] EMERGENCY DEPARTMENT AT [facility] Provider Note   CSN: [id number] Arrival date & time: 03/23/23  1828     History  Chief Complaint  Patient presents with   Alcohol Problem    Warren [name] is a 53 y.o. male.   Alcohol Problem  Patient presents to the ED after having been seen earlier today for alcohol withdrawal.  Treated with 30 mg of phenobarbital , discharged with a CIWA score of 4.  Was prescribed with Librium  taper but was unable to take any before having worsening withdrawals.  Currently experiencing worsening symptoms of hand tremors, near syncope.      Home Medications Prior to Admission medications   Medication Sig Start Date End Date Taking? Authorizing Provider  allopurinol  (ZYLOPRIM ) 100 MG tablet Take 100 mg by mouth daily. 06/09/21   [provider]  amLODipine -benazepril (LOTREL) 10-20 MG capsule Take 1 capsule by mouth daily. 07/05/21   [provider]  atorvastatin (LIPITOR) 10 MG tablet Take 10 mg by mouth daily. 06/01/21   [provider]  chlordiazePOXIDE  (LIBRIUM ) 25 MG capsule 50mg  PO TID x 1D, then 25-50mg  PO BID X 1D, then 25-50mg  PO QD X 1D 03/23/23   Mannie Pac T, DO  hydrochlorothiazide  (HYDRODIURIL ) 12.5 MG tablet Take 2 tablets (25 mg total) by mouth daily. 05/09/12   Penne Noe, MD  ibuprofen  (ADVIL ) 600 MG tablet Take 1 tablet (600 mg total) by mouth every 6 (six) hours as needed. 01/28/23   Cottie Donnice PARAS, MD  oxyCODONE -acetaminophen  (PERCOCET/ROXICET) 5-325 MG tablet Take 1 tablet by mouth every 8 (eight) hours as needed for up to 10 doses for severe pain (pain score 7-10). Patient not taking: Reported on 03/23/2023 01/28/23   Cottie Donnice PARAS, MD  pantoprazole  (PROTONIX ) 40 MG tablet Take 40 mg by mouth daily. 06/28/21   [provider]      Allergies    Patient has no known allergies.    Review of Systems   Review of Systems  Neurological:  Positive for tremors.  All other systems  reviewed and are negative.   Physical Exam Updated Vital Signs BP (!) 136/92   Pulse (!) 108   Temp 98.3 F (36.8 C) (Oral)   Resp 13   SpO2 95%  Physical Exam Vitals and nursing note reviewed.  Constitutional:      Appearance: Normal appearance. He is ill-appearing.  HENT:     Head: Normocephalic and atraumatic.  Eyes:     Extraocular Movements: Extraocular movements intact.     Conjunctiva/sclera: Conjunctivae normal.     Comments: No nystagmus noted.  Cardiovascular:     Rate and Rhythm: Regular rhythm. Tachycardia present.     Pulses: Normal pulses.     Heart sounds: Normal heart sounds. No murmur heard.    No friction rub. No gallop.  Pulmonary:     Effort: Pulmonary effort is normal. No respiratory distress.     Breath sounds: Normal breath sounds.  Abdominal:     General: Abdomen is flat.     Palpations: Abdomen is soft.     Tenderness: There is no abdominal tenderness.  Skin:    General: Skin is warm and dry.  Neurological:     Mental Status: He is alert.     Comments: Hand tremors noted bilaterally with tongue fasciculations also noted.  Psychiatric:        Mood and Affect: Mood normal.     ED Results / Procedures / Treatments   Labs (  all labs ordered are listed, but only abnormal results are displayed) Labs Reviewed - No data to display  EKG None  Radiology DG Chest 2 View Result Date: 03/23/2023 CLINICAL DATA:  Tachycardia EXAM: CHEST - 2 VIEW COMPARISON:  X-ray 01/28/2023.  Older exams as well. FINDINGS: No consolidation, pneumothorax or effusion. No edema. Normal cardiopericardial silhouette. Overlapping cardiac leads. IMPRESSION: No acute cardiopulmonary disease. Electronically Signed   By: Ranell Bring M.D.   On: 03/23/2023 16:04    Procedures .Critical Care  Performed by: Beola Terrall RAMAN, PA-C Authorized by: Beola Terrall RAMAN, PA-C   Critical care provider statement:    Critical care time (minutes):  55   Critical care time was exclusive of:   Separately billable procedures and treating other patients   Critical care was necessary to treat or prevent imminent or life-threatening deterioration of the following conditions: Alcohol withdrawal with tremors and near syncope.   Critical care was time spent personally by me on the following activities:  Development of treatment plan with patient or surrogate, discussions with consultants, evaluation of patient's response to treatment, examination of patient, ordering and review of laboratory studies, ordering and review of radiographic studies, ordering and performing treatments and interventions, pulse oximetry, re-evaluation of patient's condition, review of old charts and obtaining history from patient or surrogate   Care discussed with: accepting provider at another facility       Medications Ordered in ED Medications  PHENObarbital  (LUMINAL) injection 130 mg (130 mg Intravenous Given 03/23/23 1931)  ondansetron  (ZOFRAN ) injection 4 mg (4 mg Intravenous Given 03/23/23 1940)    ED Course/ Medical Decision Making/ A&P Clinical Course as of 03/23/23 2226  Thu Mar 23, 2023  1958 Admit for ETOH withdrawals. [CC]    Clinical Course User Index [CC] Jerral Meth, MD              HEART Score: 3                    Medical Decision Making  This patient is a 53 year old male who presents to the ED for concern of alcohol withdrawal.   Differential diagnoses prior to evaluation: The emergent differential diagnosis includes, but is not limited to, alcohol withdrawal, liver cirrhosis, beriberi, Warnicke's Korsakoff, DT. This is not an exhaustive differential.   Past Medical History / Co-morbidities / Social History: Alcohol use disorder, gout, hypertension  Additional history: Chart reviewed. Pertinent results include: Previously seen earlier today and discharged with Librium  taper which she was unable to take.  Lab Tests/Imaging studies: Labs were done earlier today with no need to  have further labs or imaging done.   Cardiac monitoring: EKG obtained and interpreted by myself and attending physician which shows: Sinus tachycardia    Medications: I ordered medication including phenobarbital .  I have reviewed the patients home medicines and have made adjustments as needed.  Critical Interventions: Provided 130 mg of phenobarbital  with repeat 130 mg phenobarbital .  ED Course:  Patient 53 year old male presents to the ED today with alcohol withdrawal.  He was seen earlier today.  Can refer to earlier note for further HPI, PE, ROS.  Was prescribed Librium  to undergo taper however was unable to take medication before experiencing worsening tremors.  Due to having a CIWA of 11, another 130mg  of phenobarbital .  CIWA score is now 6.  Due to patient failing to do well with outpatient treatment, will admit for alcohol withdrawal and monitoring.  Patient care was admitted to Dr. Franky.  Disposition: After consideration of the diagnostic results and the patients response to treatment, I feel that patient benefit from admission.  Patient is admitted to Dr. Franky.    Final Clinical Impression(s) / ED Diagnoses Final diagnoses:  Alcohol withdrawal syndrome with complication [facility])    Rx / DC Orders ED Discharge Orders     None         Ambre Kobayashi S, PA-C 03/23/23 2227    Jerral Meth, MD 03/24/23 1609

## 2023-03-23 NOTE — ED Notes (Signed)
 Pt given water and crackers for PO challenge

## 2023-03-23 NOTE — ED Notes (Signed)
Pt tolerated water and crackers.

## 2023-03-23 NOTE — ED Triage Notes (Signed)
 States he feels like he "overdone it" while drinking. Endorse being an alcoholic. Fifth of tequila a day. Last drink was last night.  C/o weakness and near syncope.

## 2023-03-23 NOTE — ED Notes (Signed)
 Pt given discharge instructions and reviewed prescriptions. Opportunities given for questions. Pt verbalizes understanding. PIV removed x1. Jillyn Hidden, RN

## 2023-03-23 NOTE — ED Notes (Signed)
 ED Provider at bedside.

## 2023-03-23 NOTE — Discharge Instructions (Addendum)
 While you are in the emergency department, you had labs done that were normal.  I think that the reason you nearly fainted today was due to withdrawing from alcohol.  I think that it is great that you are looking to cut back on your alcohol.  One of the medications that we have sent you with is 1 that is called Librium .  This medication is a benzodiazepine, and will help prevent alcohol withdrawal.  When you begin to feel symptoms of alcohol withdrawal, you can start taking the medication as instructed.  The instructions are to take 25-50 mg by mouth 3 times per day for 1 day.  The following day, you can take 25 mg 3 times per day per day the day after, you can take 25 mg 3 times per day.  If you finish this and you are still experiencing withdrawal, return to the emergency room.  Follow-up with your primary care doctor.

## 2023-03-23 NOTE — Progress Notes (Signed)
 Plan of Care Note for accepted transfer   Patient: Venkat Ankney MRN: [id number]   DOA: 03/23/2023  Facility requesting transfer: [facility].  ER. Requesting Provider: Mr. Beola.  PA. Reason for transfer: Alcohol withdrawal. Facility course: 53 year old male with known history of alcohol abuse hypertension, gout presented initially to the ER with complaints of near syncope like symptoms and tremors.  Clinically patient appeared to be in alcohol withdrawal was placed on phenobarbital  and discharged on Librium  taper but by the time patient could pick his medication patient became more tremulous and tachycardic and presents back to the ER.  At this time another dose of phenobarbital  was given and admitted for alcohol withdrawal.  Labs show elevated LFTs mild hypokalemia  Plan of care: The patient is accepted for admission to [facility] unit, at [facility]   Author: Redia LOISE Cleaver, MD 03/23/2023  Check [email or web address] for on-call coverage.  Nursing staff, Please call [facility] Admits & Consults System-Wide number on Amion as soon as patient's arrival, so appropriate admitting provider can evaluate the pt.

## 2023-03-24 ENCOUNTER — Encounter (HOSPITAL_COMMUNITY): Payer: Self-pay | Admitting: Internal Medicine

## 2023-03-24 DIAGNOSIS — E876 Hypokalemia: Secondary | ICD-10-CM | POA: Diagnosis present

## 2023-03-24 DIAGNOSIS — Z833 Family history of diabetes mellitus: Secondary | ICD-10-CM | POA: Diagnosis not present

## 2023-03-24 DIAGNOSIS — Y900 Blood alcohol level of less than 20 mg/100 ml: Secondary | ICD-10-CM | POA: Diagnosis present

## 2023-03-24 DIAGNOSIS — E860 Dehydration: Secondary | ICD-10-CM | POA: Diagnosis present

## 2023-03-24 DIAGNOSIS — E8729 Other acidosis: Secondary | ICD-10-CM | POA: Diagnosis present

## 2023-03-24 DIAGNOSIS — Z83438 Family history of other disorder of lipoprotein metabolism and other lipidemia: Secondary | ICD-10-CM | POA: Diagnosis not present

## 2023-03-24 DIAGNOSIS — I10 Essential (primary) hypertension: Secondary | ICD-10-CM | POA: Diagnosis present

## 2023-03-24 DIAGNOSIS — F10930 Alcohol use, unspecified with withdrawal, uncomplicated: Secondary | ICD-10-CM

## 2023-03-24 DIAGNOSIS — K219 Gastro-esophageal reflux disease without esophagitis: Secondary | ICD-10-CM | POA: Diagnosis present

## 2023-03-24 DIAGNOSIS — E785 Hyperlipidemia, unspecified: Secondary | ICD-10-CM | POA: Diagnosis present

## 2023-03-24 DIAGNOSIS — F419 Anxiety disorder, unspecified: Secondary | ICD-10-CM | POA: Diagnosis present

## 2023-03-24 DIAGNOSIS — Z79899 Other long term (current) drug therapy: Secondary | ICD-10-CM | POA: Diagnosis not present

## 2023-03-24 DIAGNOSIS — R7401 Elevation of levels of liver transaminase levels: Secondary | ICD-10-CM | POA: Diagnosis present

## 2023-03-24 DIAGNOSIS — F10939 Alcohol use, unspecified with withdrawal, unspecified: Secondary | ICD-10-CM | POA: Diagnosis present

## 2023-03-24 DIAGNOSIS — F1010 Alcohol abuse, uncomplicated: Secondary | ICD-10-CM | POA: Diagnosis not present

## 2023-03-24 DIAGNOSIS — M109 Gout, unspecified: Secondary | ICD-10-CM | POA: Diagnosis present

## 2023-03-24 DIAGNOSIS — E872 Acidosis, unspecified: Secondary | ICD-10-CM | POA: Diagnosis present

## 2023-03-24 DIAGNOSIS — F10230 Alcohol dependence with withdrawal, uncomplicated: Secondary | ICD-10-CM | POA: Diagnosis present

## 2023-03-24 DIAGNOSIS — E782 Mixed hyperlipidemia: Secondary | ICD-10-CM

## 2023-03-24 LAB — COMPREHENSIVE METABOLIC PANEL
ALT: 61 U/L — ABNORMAL HIGH (ref 0–44)
AST: 72 U/L — ABNORMAL HIGH (ref 15–41)
Albumin: 3.9 g/dL (ref 3.5–5.0)
Alkaline Phosphatase: 33 U/L — ABNORMAL LOW (ref 38–126)
Anion gap: 15 (ref 5–15)
BUN: 8 mg/dL (ref 6–20)
CO2: 26 mmol/L (ref 22–32)
Calcium: 9 mg/dL (ref 8.9–10.3)
Chloride: 95 mmol/L — ABNORMAL LOW (ref 98–111)
Creatinine, Ser: 0.78 mg/dL (ref 0.61–1.24)
GFR, Estimated: >60 mL/min (ref >60)
Glucose, Bld: 100 mg/dL — ABNORMAL HIGH (ref 70–99)
Potassium: 2.6 mmol/L — CL (ref 3.5–5.1)
Sodium: 136 mmol/L (ref 135–145)
Total Bilirubin: 1.5 mg/dL — ABNORMAL HIGH (ref 0.0–1.2)
Total Protein: 6.8 g/dL (ref 6.5–8.1)

## 2023-03-24 LAB — CBC WITH DIFFERENTIAL/PLATELET
Abs Immature Granulocytes: 0.04 10*3/uL (ref 0.00–0.07)
Basophils Absolute: 0.1 10*3/uL (ref 0.0–0.1)
Basophils Relative: 1 %
Eosinophils Absolute: 0 10*3/uL (ref 0.0–0.5)
Eosinophils Relative: 1 %
HCT: 43.8 % (ref 39.0–52.0)
Hemoglobin: 15.3 g/dL (ref 13.0–17.0)
Immature Granulocytes: 1 %
Lymphocytes Relative: 33 %
Lymphs Abs: 2.8 10*3/uL (ref 0.7–4.0)
MCH: 31 pg (ref 26.0–34.0)
MCHC: 34.9 g/dL (ref 30.0–36.0)
MCV: 88.8 fL (ref 80.0–100.0)
Monocytes Absolute: 0.7 10*3/uL (ref 0.1–1.0)
Monocytes Relative: 9 %
Neutro Abs: 4.8 10*3/uL (ref 1.7–7.7)
Neutrophils Relative %: 55 %
Platelets: 257 10*3/uL (ref 150–400)
RBC: 4.93 MIL/uL (ref 4.22–5.81)
RDW: 13.3 % (ref 11.5–15.5)
WBC: 8.4 10*3/uL (ref 4.0–10.5)
nRBC: 0 % (ref 0.0–0.2)

## 2023-03-24 LAB — RAPID URINE DRUG SCREEN, HOSP PERFORMED
Amphetamines: NOT DETECTED
Barbiturates: POSITIVE — AB
Benzodiazepines: NOT DETECTED
Cocaine: NOT DETECTED
Opiates: NOT DETECTED
Tetrahydrocannabinol: NOT DETECTED

## 2023-03-24 LAB — SALICYLATE LEVEL: Salicylate Lvl: <7 mg/dL — ABNORMAL LOW (ref 7.0–30.0)

## 2023-03-24 LAB — ETHANOL: Alcohol, Ethyl (B): <10 mg/dL (ref <10)

## 2023-03-24 LAB — CK: Total CK: 704 U/L — ABNORMAL HIGH (ref 49–397)

## 2023-03-24 LAB — PHOSPHORUS: Phosphorus: 3.3 mg/dL (ref 2.5–4.6)

## 2023-03-24 LAB — PROTIME-INR
INR: 1.1 (ref 0.8–1.2)
Prothrombin Time: 13.9 s (ref 11.4–15.2)

## 2023-03-24 LAB — APTT: aPTT: 26 s (ref 24–36)

## 2023-03-24 LAB — MRSA NEXT GEN BY PCR, NASAL: MRSA by PCR Next Gen: NOT DETECTED

## 2023-03-24 LAB — MAGNESIUM: Magnesium: 1.7 mg/dL (ref 1.7–2.4)

## 2023-03-24 MED ORDER — MELATONIN 3 MG PO TABS
3.0000 mg | ORAL_TABLET | Freq: Every evening | ORAL | Status: DC | PRN
  Administered 2023-03-24: 3 mg via ORAL
  Filled 2023-03-24: qty 1

## 2023-03-24 MED ORDER — PANTOPRAZOLE SODIUM 40 MG PO TBEC
40.0000 mg | DELAYED_RELEASE_TABLET | Freq: Every day | ORAL | Status: DC
  Administered 2023-03-24 – 2023-03-25 (×2): 40 mg via ORAL
  Filled 2023-03-24 (×2): qty 1

## 2023-03-24 MED ORDER — COLCHICINE 0.6 MG PO TABS: 0.6000 mg | ORAL_TABLET | Freq: Two times a day (BID) | ORAL | Status: DC | PRN

## 2023-03-24 MED ORDER — LISINOPRIL 10 MG PO TABS
20.0000 mg | ORAL_TABLET | Freq: Every day | ORAL | Status: DC
  Administered 2023-03-24 – 2023-03-25 (×2): 20 mg via ORAL
  Filled 2023-03-24 (×2): qty 2

## 2023-03-24 MED ORDER — SODIUM CHLORIDE 0.9 % IV SOLN: INTRAVENOUS | Status: AC

## 2023-03-24 MED ORDER — POTASSIUM CHLORIDE CRYS ER 20 MEQ PO TBCR
40.0000 meq | EXTENDED_RELEASE_TABLET | Freq: Once | ORAL | Status: AC
  Administered 2023-03-24: 40 meq via ORAL
  Filled 2023-03-24: qty 2

## 2023-03-24 MED ORDER — ALLOPURINOL 100 MG PO TABS
100.0000 mg | ORAL_TABLET | Freq: Every day | ORAL | Status: DC
  Administered 2023-03-24 – 2023-03-25 (×2): 100 mg via ORAL
  Filled 2023-03-24 (×2): qty 1

## 2023-03-24 MED ORDER — DOCUSATE SODIUM 100 MG PO CAPS
100.0000 mg | ORAL_CAPSULE | Freq: Two times a day (BID) | ORAL | Status: DC
  Administered 2023-03-25: 100 mg via ORAL
  Filled 2023-03-24: qty 1

## 2023-03-24 MED ORDER — FOLIC ACID 1 MG PO TABS
1.0000 mg | ORAL_TABLET | Freq: Every day | ORAL | Status: DC
  Administered 2023-03-24 – 2023-03-25 (×2): 1 mg via ORAL
  Filled 2023-03-24 (×2): qty 1

## 2023-03-24 MED ORDER — ONDANSETRON HCL 4 MG/2ML IJ SOLN: 4.0000 mg | Freq: Four times a day (QID) | INTRAMUSCULAR | Status: DC | PRN

## 2023-03-24 MED ORDER — THIAMINE MONONITRATE 100 MG PO TABS
100.0000 mg | ORAL_TABLET | Freq: Every day | ORAL | Status: DC
  Administered 2023-03-24 – 2023-03-25 (×3): 100 mg via ORAL
  Filled 2023-03-24 (×3): qty 1

## 2023-03-24 MED ORDER — POTASSIUM CHLORIDE CRYS ER 20 MEQ PO TBCR
40.0000 meq | EXTENDED_RELEASE_TABLET | Freq: Once | ORAL | Status: DC
  Administered 2023-03-24: 40 meq via ORAL
  Filled 2023-03-24: qty 2

## 2023-03-24 MED ORDER — POTASSIUM CHLORIDE 10 MEQ/100ML IV SOLN
10.0000 meq | INTRAVENOUS | Status: DC
  Administered 2023-03-24 (×4): 10 meq via INTRAVENOUS
  Filled 2023-03-24 (×4): qty 100

## 2023-03-24 MED ORDER — ACETAMINOPHEN 650 MG RE SUPP: 650.0000 mg | Freq: Four times a day (QID) | RECTAL | Status: DC | PRN

## 2023-03-24 MED ORDER — AMLODIPINE BESYLATE 10 MG PO TABS
10.0000 mg | ORAL_TABLET | Freq: Every day | ORAL | Status: DC
  Administered 2023-03-24 – 2023-03-25 (×2): 10 mg via ORAL
  Filled 2023-03-24 (×2): qty 1

## 2023-03-24 MED ORDER — ADULT MULTIVITAMIN W/MINERALS CH
1.0000 | ORAL_TABLET | Freq: Every day | ORAL | Status: DC
  Administered 2023-03-24 – 2023-03-25 (×2): 1 via ORAL
  Filled 2023-03-24 (×2): qty 1

## 2023-03-24 MED ORDER — MAGNESIUM SULFATE 2 GM/50ML IV SOLN
2.0000 g | Freq: Once | INTRAVENOUS | Status: AC
  Administered 2023-03-24: 2 g via INTRAVENOUS
  Filled 2023-03-24: qty 50

## 2023-03-24 MED ORDER — ACETAMINOPHEN 325 MG PO TABS: 650.0000 mg | ORAL_TABLET | Freq: Four times a day (QID) | ORAL | Status: DC | PRN

## 2023-03-24 MED ORDER — CHLORHEXIDINE GLUCONATE CLOTH 2 % EX PADS
6.0000 | MEDICATED_PAD | Freq: Every day | CUTANEOUS | Status: DC
Start: 2023-03-24 — End: 2023-03-25
  Filled 2023-03-24: qty 6

## 2023-03-24 MED ORDER — THIAMINE HCL 100 MG/ML IJ SOLN: 100.0000 mg | Freq: Every day | INTRAMUSCULAR | Status: DC

## 2023-03-24 MED ORDER — IBUPROFEN 200 MG PO TABS: 400.0000 mg | ORAL_TABLET | Freq: Four times a day (QID) | ORAL | Status: DC | PRN

## 2023-03-24 MED ORDER — LORAZEPAM 2 MG/ML IJ SOLN: 1.0000 mg | INTRAMUSCULAR | Status: DC | PRN

## 2023-03-24 MED ORDER — BISACODYL 5 MG PO TBEC
10.0000 mg | DELAYED_RELEASE_TABLET | Freq: Every day | ORAL | Status: DC | PRN
  Administered 2023-03-24: 10 mg via ORAL
  Filled 2023-03-24: qty 2

## 2023-03-24 MED ORDER — LORAZEPAM 1 MG PO TABS
1.0000 mg | ORAL_TABLET | ORAL | Status: DC | PRN
  Administered 2023-03-24: 1 mg via ORAL
  Filled 2023-03-24: qty 1

## 2023-03-24 NOTE — H&P (Signed)
 History and Physical      Warren [name]:[id number] DOB: Jun 14, 1970 DOA: 03/23/2023; DOS: 03/24/2023  PCP: Ransom Other, MD  Patient coming from: home   I have personally briefly reviewed patient's old medical records in [facility]  Chief Complaint: Alcohol detoxification  HPI: Warren [name] is a 53 y.o. male with medical history significant for chronic alcohol abuse, essential hypertension, hyperlipidemia, GERD, who is admitted to [facility] on 03/23/2023 by way of transfer from [facility] for acute alcohol withdrawal after presenting from home to the latter facility requesting assistance with alcohol detoxification.  Patient with history of chronic alcohol abuse, consumption of daily alcohol, who presented to [facility] ED this evening requesting assistance with alcohol detoxification.  He is last alcoholic beverage occurred on 10/26/7969 5.  Over the last day he has noted new onset tremulous features associated with the bilateral upper extremities, and is felt some mild dizziness, without overt presyncope, syncope, fall.  Denies any associated chest pain, palpitations, diaphoresis.  No recent subjective fever, chills, rigors, or generalized myalgias.  Upon initially being seen at [facility] earlier this evening, there is suspicion for alcohol withdrawal.  The patient received a dose of phenobarbital  and was discharged with a prescription for Librium  taper.  However, before the patient was able to obtain the Librium  from the outpatient pharmacy, he felt that his tremulousness was worsening, prompting him to present back to [facility] for further evaluation management thereof.  The above is not been associate with any overt tonic-clonic activity nor any tongue biting or any loss of bowel/bladder function.  Denies any acute focal weakness.  No recent abdominal pain, diarrhea, melena, hematochezia.    ED Course:  Vital signs in the ED were notable for the following: Afebrile; heart  rates in the 90s to 100; stop blood pressures in the 120s to 150s; respiratory rate 14-20, oxygen saturation 94 to 97% on room air.  Labs were notable for the following: CMP was notable for potassium 3.2, bicarbonate 21, anion gap 22, creatinine 1.09, glucose 131, AST 99 compared to 44 on 01/28/2023, ALT 80 compared to 34 on 01/28/2023.  Otherwise, liver enzymes were within normal limits.  Magnesium  1.7.  CBC notable for Locasol count 7300, hemoglobin 16.  Per my interpretation, EKG in ED demonstrated the following: Sinus tachycardia with initial heart rate 143, nonspecific T wave version in lead III, nonspecific ST depression in V6, no evidence of ST elevation.  Imaging in the ED, per corresponding formal radiology read, was notable for the following: 2 view chest x-ray showed no evidence of acute cardiopulmonary process.  While in the ED, the following were administered: Zofran  4 mg IV x 1, phenobarbital  130 mg IV x 1 dose.  Subsequently, the patient was admitted to was alone for further evaluation management of presenting alcohol withdrawal, with presenting labs notable for hypokalemia, anion gap metabolic acidosis as well as acute transaminitis.     Review of Systems: As per HPI otherwise 10 point review of systems negative.   Past Medical History:  Diagnosis Date   Chronic alcohol abuse    GERD (gastroesophageal reflux disease)    GI bleed    Hypertension    Ulcer     History reviewed. No pertinent surgical history.  Social History:  reports that he has never smoked. He has never used smokeless tobacco. He reports current alcohol use. He reports that he does not use drugs.   No Known Allergies  Family History  Problem Relation Age of  Onset   Diabetes Mother    Hyperlipidemia Mother     Family history reviewed and not pertinent    Prior to Admission medications   Medication Sig Start Date End Date Taking? Authorizing Provider  allopurinol  (ZYLOPRIM ) 100 MG tablet Take  100 mg by mouth daily. 06/09/21   [provider]  amLODipine -benazepril (LOTREL) 10-20 MG capsule Take 1 capsule by mouth daily. 07/05/21   [provider]  atorvastatin (LIPITOR) 10 MG tablet Take 10 mg by mouth daily. 06/01/21   [provider]  chlordiazePOXIDE  (LIBRIUM ) 25 MG capsule 50mg  PO TID x 1D, then 25-50mg  PO BID X 1D, then 25-50mg  PO QD X 1D 03/23/23   Mannie Pac T, DO  hydrochlorothiazide  (HYDRODIURIL ) 12.5 MG tablet Take 2 tablets (25 mg total) by mouth daily. 05/09/12   Penne Noe, MD  ibuprofen  (ADVIL ) 600 MG tablet Take 1 tablet (600 mg total) by mouth every 6 (six) hours as needed. 01/28/23   Cottie Donnice PARAS, MD  oxyCODONE -acetaminophen  (PERCOCET/ROXICET) 5-325 MG tablet Take 1 tablet by mouth every 8 (eight) hours as needed for up to 10 doses for severe pain (pain score 7-10). Patient not taking: Reported on 03/23/2023 01/28/23   Cottie Donnice PARAS, MD  pantoprazole  (PROTONIX ) 40 MG tablet Take 40 mg by mouth daily. 06/28/21   [provider]     Objective    Physical Exam: Vitals:   03/24/23 0200 03/24/23 0224 03/24/23 0300 03/24/23 0322  BP: (!) 152/102  (!) 141/102   Pulse: 95 96 82 94  Resp: 10 13 15    Temp:      TempSrc:      SpO2: 94% 94% 95%   Weight:      Height:        General: appears to be stated age; alert, oriented Skin: warm, dry, no rash Head:  AT/Francisco Mouth:  Oral mucosa membranes appear dry, normal dentition Neck: supple; trachea midline Heart: Mildly tachycardic, but regular; did not appreciate any M/R/G Lungs: CTAB, did not appreciate any wheezes, rales, or rhonchi Abdomen: + BS; soft, ND, NT Vascular: 2+ pedal pulses b/l; 2+ radial pulses b/l Extremities: no peripheral edema, no muscle wasting Neuro: strength and sensation intact in upper and lower extremities b/l    Labs on Admission: I have personally reviewed following labs and imaging studies  CBC: Recent Labs  Lab 03/23/23 1258  WBC 7.3   NEUTROABS 2.4  HGB 16.0  HCT 45.0  MCV 87.2  PLT 315   Basic Metabolic Panel: Recent Labs  Lab 03/23/23 1258  NA 137  K 3.2*  CL 94*  CO2 21*  GLUCOSE 131*  BUN 12  CREATININE 1.09  CALCIUM 9.2  MG 1.7   GFR: Estimated Creatinine Clearance: 100.1 mL/min (by C-G formula based on SCr of 1.09 mg/dL). Liver Function Tests: Recent Labs  Lab 03/23/23 1258  AST 99*  ALT 80*  ALKPHOS 40  BILITOT 1.1  PROT 7.5  ALBUMIN 4.5   No results for input(s): LIPASE, AMYLASE in the last 168 hours. No results for input(s): AMMONIA in the last 168 hours. Coagulation Profile: No results for input(s): INR, PROTIME in the last 168 hours. Cardiac Enzymes: No results for input(s): CKTOTAL, CKMB, CKMBINDEX, TROPONINI in the last 168 hours. BNP (last 3 results) No results for input(s): PROBNP in the last 8760 hours. HbA1C: No results for input(s): HGBA1C in the last 72 hours. CBG: Recent Labs  Lab 03/23/23 1329  GLUCAP 130*   Lipid Profile:  No results for input(s): CHOL, HDL, LDLCALC, TRIG, CHOLHDL, LDLDIRECT in the last 72 hours. Thyroid Function Tests: No results for input(s): TSH, T4TOTAL, FREET4, T3FREE, THYROIDAB in the last 72 hours. Anemia Panel: No results for input(s): VITAMINB12, FOLATE, FERRITIN, TIBC, IRON, RETICCTPCT in the last 72 hours. Urine analysis:    Component Value Date/Time   COLORURINE YELLOW 01/28/2023 1901   APPEARANCEUR CLEAR 01/28/2023 1901   LABSPEC 1.021 01/28/2023 1901   PHURINE 6.0 01/28/2023 1901   GLUCOSEU NEGATIVE 01/28/2023 1901   HGBUR NEGATIVE 01/28/2023 1901   BILIRUBINUR NEGATIVE 01/28/2023 1901   KETONESUR NEGATIVE 01/28/2023 1901   PROTEINUR 30 (A) 01/28/2023 1901   NITRITE NEGATIVE 01/28/2023 1901   LEUKOCYTESUR NEGATIVE 01/28/2023 1901    Radiological Exams on Admission: DG Chest 2 View Result Date: 03/23/2023 CLINICAL DATA:  Tachycardia EXAM: CHEST - 2 VIEW COMPARISON:   X-ray 01/28/2023.  Older exams as well. FINDINGS: No consolidation, pneumothorax or effusion. No edema. Normal cardiopericardial silhouette. Overlapping cardiac leads. IMPRESSION: No acute cardiopulmonary disease. Electronically Signed   By: Ranell Bring M.D.   On: 03/23/2023 16:04      Assessment/Plan    Principal Problem:   Alcohol withdrawal (HCC) Active Problems:   Chronic alcohol abuse   Hypokalemia   High anion gap metabolic acidosis   Transaminitis   Essential hypertension   HLD (hyperlipidemia)   GERD (gastroesophageal reflux disease)    #) Acute alcohol withdrawal: In the context of a history of chronic abuse, with most recent alcohol consumed approximately 2 days ago, presentation appears associated with early evidence of alcohol withdrawal, including evidence of mild tachycardia, tremors of the bilateral upper extremities, anxiety, mildly elevated blood pressure, all in the setting of the patient reporting her desire to completely quit alcohol consumption.  No evidence of seizures at this time.    Plan: Symptoms-based CIWA protocol with prn Ativan  ordered. counseled the patient on the importance of reduction in alcohol consumption. Consult to transition of care team placed. Close monitoring of ensuing BP and HR via routine VS. Seizure precautions. Telemetry. A Check serum phosphorus level. Repeat CMP in the morning. Check INR.  Daily thiamine , folic acid , multivitamin supplementation, first dose of thiamine  supplementation to occur now.  As needed Zofran .                 #) Hypokalemia: presenting potassium level noted to be 3.2, likely as a consequence of relative hyper nutrition from chronic alcohol abuse.  Presenting magnesium  level 1.7.  Plan: monitor on tele. KCl 40 meq p.o. x 1 dose now.  Magnesium  sulfate 2 g IV over 2 hours.  CMP, mag level in the AM.                   #) Anion gap metabolic acidosis: Identified on presenting CMP, and  appears no suggestive of alcoholic lactic acidosis.  No evidence of elevation in blood sugar to suggest DKA.  Additionally, no overt evidence of underlying infectious process, including chest x-ray today with no evidence of infiltrate.  Plan: Continuous normal saline, as above.  Add on serum ethanol level, urinary drug screen, CPK level, INR, salicylate level.  Repeat CMP, CBC in the morning.                #) Acute transaminitis: Very mild elevation in transaminases AST predominant, favoring sources being his chronic alcohol consumption.  Appears hepatocellular in distribution, without any evidence of elevation in alkaline phosphatase or total bilirubin to suggest  a cholestatic pattern.  No abdominal pain, and physical exam reveals no evidence of acute peritoneal signs.  Of note, in the absence of any abdominal discomfort in the absence of elevation in total bilirubin, patient's mild elevation in transaminases appears much less suggestive of acute alcoholic hepatitis.  Will continue to trend ensuing liver enzymes, as outlined below.  Plan: Further evaluation management of chronic alcohol abuse, as above.  Add on INR, PTT, urinary drug screen, serum ethanol level.  Hold home Lipitor for now.  Repeat CMP in the morning.                   #) Hyperlipidemia: documented h/o such. On atorvastatin as outpatient.   Plan: Hold home atorvastatin for now in the setting of interval development of acute transaminitis, as above.                 #) Essential Hypertension: documented h/o such, with outpatient antihypertensive regimen including HCTZ, lisinopril , Norvasc .  SBP's in the ED today: 120s to 150s mmHg. in the setting of mild dehydration, will hold home HCTZ for now.  Plan: Close monitoring of subsequent BP via routine VS. resume home Norvasc  and lisinopril .  Hold home HCTZ for now, as above.                   #) GERD: documented h/o such; on  Protonix  as outpatient.   Plan: continue home PPI.      DVT prophylaxis: SCD's   Code Status: Full code Family Communication: none Disposition Plan: Per Rounding Team Consults called: none;  Admission status: inpatient    I SPENT GREATER THAN 75  MINUTES IN CLINICAL CARE TIME/MEDICAL DECISION-MAKING IN COMPLETING THIS ADMISSION.     June Vacha B Samarion Ehle DO [facility] From 7PM - 7AM   03/24/2023, 3:25 AM

## 2023-03-24 NOTE — Plan of Care (Signed)
 CIWA=0,Plan of care and patient goals discussed with patient and his wife, time given for questions, patient handbook/guide at bedside, bed in lowest locked position with call bell in reach and bed alarm on.  Problem: Education: Goal: Knowledge of General Education information will improve Description: Including pain rating scale, medication(s)/side effects and non-pharmacologic comfort measures Outcome: Progressing   Problem: Health Behavior/Discharge Planning: Goal: Ability to manage health-related needs will improve Outcome: Progressing   Problem: Clinical Measurements: Goal: Ability to maintain clinical measurements within normal limits will improve Outcome: Progressing Goal: Will remain free from infection Outcome: Progressing Goal: Diagnostic test results will improve Outcome: Progressing Goal: Respiratory complications will improve Outcome: Progressing Goal: Cardiovascular complication will be avoided Outcome: Progressing   Problem: Activity: Goal: Risk for activity intolerance will decrease Outcome: Progressing   Problem: Nutrition: Goal: Adequate nutrition will be maintained Outcome: Progressing   Problem: Coping: Goal: Level of anxiety will decrease Outcome: Progressing   Problem: Elimination: Goal: Will not experience complications related to bowel motility Outcome: Progressing Goal: Will not experience complications related to urinary retention Outcome: Progressing   Problem: Pain Management: Goal: General experience of comfort will improve Outcome: Progressing   Problem: Safety: Goal: Ability to remain free from injury will improve Outcome: Progressing   Problem: Skin Integrity: Goal: Risk for impaired skin integrity will decrease Outcome: Progressing

## 2023-03-24 NOTE — Progress Notes (Signed)
 Same day note  Warren [name] is a 53 y.o. male with medical history significant for chronic alcohol abuse, essential hypertension, hyperlipidemia, GERD, presented to [facility] ED with tremors dizziness and requesting alcohol detoxification.  Initially patient received a dose of phenobarbital  and was discharged with a prescription for Librium  taper.  However, before the patient was able to obtain the Librium  from the outpatient pharmacy, he felt that his tremulousness was worsening, prompting him to present back to [facility].  In the ED patient had stable vitals.  Labs showed hypokalemia with potassium of 3.2.  EKG showed sinus tachycardia.  Chest x-ray without any infiltrate.  Patient was then considered for admission to the hospital for further evaluation and treatment. Patient seen and examined at bedside.  Patient was admitted to the hospital for alcohol detox  At the time of my evaluation, patient complains of feeling little better.  Has less tremors denies sweating hallucinations.  No vomiting now was able to tolerate some food.  Physical examination reveals muscular gentleman, not in obvious distress.  Laboratory data and imaging was reviewed   Assessment and plan  Acute alcohol withdrawal:  Last drink 2 days prior to presentation.  Had tremors anxiety on presentation.  Continue symptom based  CIWA protocol with prn Ativan . Seizure precautions. Telemetry.  Continue thiamine , folic acid , multivitamin.  Urine drug screen was positive for barbiturates as expected.  Feels a little better than when he came to the hospital.  Hypokalemia: Continue potassium and magnesium  supplementation.  Potassium this morning at 2.6.  Will aggressively replace IV and oral potassium..  Latest magnesium  of 1.7.  Check BMP in AM.    Anion gap metabolic acidosis: Improved at this time.  Received IV fluids.  Encourage oral hydration.    Elevated LFT.  Secondary to EtOH abuse.  Hold Lipitor for now.  Counseling  done.    Hyperlipidemia: On atorvastatin as outpatient.  Will continue to hold for now.   Essential Hypertension: Hold HCTZ.  Continue Norvasc  and lisinopril .SABRA     GERD: Continue Protonix .   No Charge  Signed,  Vernal Anselm Alstrom, MD [facility]

## 2023-03-24 NOTE — Hospital Course (Signed)
 Warren [name] is a 53 y.o. male with medical history significant for chronic alcohol abuse, essential hypertension, hyperlipidemia, GERD, presented to [facility] ED with tremors dizziness and requesting alcohol detoxification.  Initially patient received a dose of phenobarbital  and was discharged with a prescription for Librium  taper.  However, before the patient was able to obtain the Librium  from the outpatient pharmacy, he felt that his tremulousness was worsening, prompting him to present back to [facility].  In the ED patient had stable vitals.  Labs showed hypokalemia with potassium of 3.2.  EKG showed sinus tachycardia.  Chest x-ray without any infiltrate.  Patient was then considered for admission to the hospital for further evaluation and treatment.  Assessment and plan  Acute alcohol withdrawal:  Last drink 2 days prior to presentation.  Had tremors anxiety on presentation.  Continue symptom based  CIWA protocol with prn Ativan . Seizure precautions. Telemetry.  Continue thiamine , folic acid , multivitamin.  Urine drug screen was positive for barbiturates as expected.  Hypokalemia: Continue potassium and magnesium  supplementation.  Potassium this morning at 2.6.  Will aggressively replace IV and oral potassium..  Latest magnesium  of 1.7.    Anion gap metabolic acidosis: Improved at this time.  Received IV fluids.     Elevated LFT.  Secondary to EtOH abuse.  Hold Lipitor for now.  Counseling done.    Hyperlipidemia: On atorvastatin as outpatient.  Will continue to hold for now.   Essential Hypertension: Hold HCTZ.  Continue Norvasc  and lisinopril ..     GERD: Continue Protonix .

## 2023-03-24 NOTE — ED Notes (Signed)
 ED TO INPATIENT HANDOFF REPORT  ED Nurse Name and Phone #:  Leonor Dollar [phone]  S Name/Age/Gender Warren [name] 53 y.o. male Room/Bed: DB005/DB005  Code Status   Code Status: Not on file  Home/SNF/Other Home Patient oriented to: self, place, time, and situation Is this baseline? Yes   Triage Complete: Triage complete  Chief Complaint Alcohol withdrawal (HCC) [F10.939]  Triage Note No notes on file   Allergies No Known Allergies  Level of Care/Admitting Diagnosis ED Disposition     ED Disposition  Admit   Condition  --   Comment  Hospital Area: [facility] [[id number]]  Level of Care: Stepdown [14]  Admit to SDU based on following criteria: Severe physiological/psychological symptoms:  Any diagnosis requiring assessment & intervention at least every 4 hours on an ongoing basis to obtain desired patient outcomes including stability and rehabilitation  May admit patient to [facility] or [facility] if equivalent level of care is available:: Yes  Interfacility transfer: Yes  Covid Evaluation: Asymptomatic - no recent exposure (last 10 days) testing not required  Diagnosis: Alcohol withdrawal (HCC) [291.81.ICD-9-CM]  Admitting Physician: KAKRAKANDY, ARSHAD N [[id number]]  Attending Physician: FRANKY REDIA SAILOR [phone]  Certification:: I certify this patient will need inpatient services for at least 2 midnights  Expected Medical Readiness: 03/25/2023          B Medical/Surgery History Past Medical History:  Diagnosis Date   GI bleed    Hypertension    Ulcer    No past surgical history on file.   A IV Location/Drains/Wounds Patient Lines/Drains/Airways Status     Active Line/Drains/Airways     Name Placement date Placement time Site Days   Peripheral IV 03/23/23 20 G Anterior;Left Forearm 03/23/23  1855  Forearm  1            Intake/Output Last 24 hours No intake or output data in the 24 hours ending 03/24/23  0018  Labs/Imaging Results for orders placed or performed during the hospital encounter of 03/23/23 (from the past 48 hours)  CBC with Differential     Status: None   Collection Time: 03/23/23 12:58 PM  Result Value Ref Range   WBC 7.3 4.0 - 10.5 K/uL   RBC 5.16 4.22 - 5.81 MIL/uL   Hemoglobin 16.0 13.0 - 17.0 g/dL   HCT 54.9 60.9 - 47.9 %   MCV 87.2 80.0 - 100.0 fL   MCH 31.0 26.0 - 34.0 pg   MCHC 35.6 30.0 - 36.0 g/dL   RDW 86.6 88.4 - 84.4 %   Platelets 315 150 - 400 K/uL   nRBC 0.0 0.0 - 0.2 %   Neutrophils Relative % 34 %   Neutro Abs 2.4 1.7 - 7.7 K/uL   Lymphocytes Relative 54 %   Lymphs Abs 3.9 0.7 - 4.0 K/uL   Monocytes Relative 9 %   Monocytes Absolute 0.7 0.1 - 1.0 K/uL   Eosinophils Relative 1 %   Eosinophils Absolute 0.1 0.0 - 0.5 K/uL   Basophils Relative 1 %   Basophils Absolute 0.1 0.0 - 0.1 K/uL   Immature Granulocytes 1 %   Abs Immature Granulocytes 0.06 0.00 - 0.07 K/uL    Comment: Performed at [facility], [address] [place], [place] [address]  Comprehensive metabolic panel     Status: Abnormal   Collection Time: 03/23/23 12:58 PM  Result Value Ref Range   Sodium 137 135 - 145 mmol/L   Potassium 3.2 (L) 3.5 -  5.1 mmol/L   Chloride 94 (L) 98 - 111 mmol/L   CO2 21 (L) 22 - 32 mmol/L   Glucose, Bld 131 (H) 70 - 99 mg/dL    Comment: Glucose reference range applies only to samples taken after fasting for at least 8 hours.   BUN 12 6 - 20 mg/dL   Creatinine, Ser 8.90 0.61 - 1.24 mg/dL   Calcium 9.2 8.9 - 89.6 mg/dL   Total Protein 7.5 6.5 - 8.1 g/dL   Albumin 4.5 3.5 - 5.0 g/dL   AST 99 (H) 15 - 41 U/L   ALT 80 (H) 0 - 44 U/L   Alkaline Phosphatase 40 38 - 126 U/L   Total Bilirubin 1.1 0.0 - 1.2 mg/dL   GFR, Estimated >39 >39 mL/min    Comment: (NOTE) Calculated using the CKD-EPI Creatinine Equation (2021)    Anion gap 22 (H) 5 - 15    Comment: Electrolytes repeated to confirm. Performed at [facility],  [address] [place], [place] [address]   Magnesium      Status: None   Collection Time: 03/23/23 12:58 PM  Result Value Ref Range   Magnesium  1.7 1.7 - 2.4 mg/dL    Comment: Performed at [facility], [address] [place], [place] [address]  Troponin I (High Sensitivity)     Status: None   Collection Time: 03/23/23 12:58 PM  Result Value Ref Range   Troponin I (High Sensitivity) 5 <18 ng/L    Comment: (NOTE) Elevated high sensitivity troponin I (hsTnI) values and significant  changes across serial measurements may suggest ACS but many other  chronic and acute conditions are known to elevate hsTnI results.  Refer to the Links section for chest pain algorithms and additional  guidance. Performed at [facility], [address] [place], [place] [address]   POC CBG, ED     Status: Abnormal   Collection Time: 03/23/23  1:29 PM  Result Value Ref Range   Glucose-Capillary 130 (H) 70 - 99 mg/dL    Comment: Glucose reference range applies only to samples taken after fasting for at least 8 hours.   DG Chest 2 View Result Date: 03/23/2023 CLINICAL DATA:  Tachycardia EXAM: CHEST - 2 VIEW COMPARISON:  X-ray 01/28/2023.  Older exams as well. FINDINGS: No consolidation, pneumothorax or effusion. No edema. Normal cardiopericardial silhouette. Overlapping cardiac leads. IMPRESSION: No acute cardiopulmonary disease. Electronically Signed   By: Ranell Bring M.D.   On: 03/23/2023 16:04    Pending Labs Unresulted Labs (From admission, onward)    None       Vitals/Pain Today's Vitals   03/23/23 2230 03/23/23 2330 03/24/23 0000 03/24/23 0016  BP: (!) 134/90 (!) 137/100 124/83   Pulse: (!) 105 (!) 104 (!) 102   Resp: 14  14   Temp:    98.6 F (37 C)  TempSrc:    Oral  SpO2: 93%  95%   PainSc:        Isolation Precautions No active isolations  Medications Medications  PHENObarbital  (LUMINAL) injection 130 mg (130 mg Intravenous Given  03/23/23 1931)  ondansetron  (ZOFRAN ) injection 4 mg (4 mg Intravenous Given 03/23/23 1940)    Mobility walks     Focused Assessments Cardiac Assessment Handoff:  Cardiac Rhythm: Sinus tachycardia No results found for: CKTOTAL, CKMB, CKMBINDEX, TROPONINI No results found for: DDIMER Does the Patient currently have chest pain? No    R Recommendations: See Admitting Provider Note  Report given to:  Additional Notes:  CIWA- 4 @ 2330

## 2023-03-25 DIAGNOSIS — K219 Gastro-esophageal reflux disease without esophagitis: Secondary | ICD-10-CM | POA: Diagnosis not present

## 2023-03-25 DIAGNOSIS — F10930 Alcohol use, unspecified with withdrawal, uncomplicated: Secondary | ICD-10-CM | POA: Diagnosis not present

## 2023-03-25 DIAGNOSIS — I10 Essential (primary) hypertension: Secondary | ICD-10-CM | POA: Diagnosis not present

## 2023-03-25 LAB — CBC
HCT: 43.2 % (ref 39.0–52.0)
Hemoglobin: 14.4 g/dL (ref 13.0–17.0)
MCH: 30.8 pg (ref 26.0–34.0)
MCHC: 33.3 g/dL (ref 30.0–36.0)
MCV: 92.3 fL (ref 80.0–100.0)
Platelets: 245 10*3/uL (ref 150–400)
RBC: 4.68 MIL/uL (ref 4.22–5.81)
RDW: 13.2 % (ref 11.5–15.5)
WBC: 7.3 10*3/uL (ref 4.0–10.5)
nRBC: 0 % (ref 0.0–0.2)

## 2023-03-25 LAB — MAGNESIUM: Magnesium: 2.1 mg/dL (ref 1.7–2.4)

## 2023-03-25 LAB — BASIC METABOLIC PANEL
Anion gap: 8 (ref 5–15)
BUN: 7 mg/dL (ref 6–20)
CO2: 25 mmol/L (ref 22–32)
Calcium: 8.6 mg/dL — ABNORMAL LOW (ref 8.9–10.3)
Chloride: 102 mmol/L (ref 98–111)
Creatinine, Ser: 0.94 mg/dL (ref 0.61–1.24)
GFR, Estimated: >60 mL/min (ref >60)
Glucose, Bld: 114 mg/dL — ABNORMAL HIGH (ref 70–99)
Potassium: 3.2 mmol/L — ABNORMAL LOW (ref 3.5–5.1)
Sodium: 135 mmol/L (ref 135–145)

## 2023-03-25 MED ORDER — HYDROCHLOROTHIAZIDE 25 MG PO TABS: 25.0000 mg | ORAL_TABLET | Freq: Every day | ORAL | Status: DC

## 2023-03-25 MED ORDER — POTASSIUM CHLORIDE CRYS ER 20 MEQ PO TBCR
40.0000 meq | EXTENDED_RELEASE_TABLET | Freq: Once | ORAL | Status: AC
  Administered 2023-03-25: 40 meq via ORAL
  Filled 2023-03-25: qty 2

## 2023-03-25 MED ORDER — HYDRALAZINE HCL 20 MG/ML IJ SOLN: 10.0000 mg | Freq: Once | INTRAMUSCULAR | Status: DC

## 2023-03-25 MED ORDER — ADULT MULTIVITAMIN W/MINERALS CH: 1.0000 | ORAL_TABLET | Freq: Every day | ORAL | Status: AC

## 2023-03-25 MED ORDER — FOLIC ACID 1 MG PO TABS: 1.0000 mg | ORAL_TABLET | Freq: Every day | ORAL | 0 refills | Status: AC

## 2023-03-25 MED ORDER — POTASSIUM CHLORIDE CRYS ER 20 MEQ PO TBCR
20.0000 meq | EXTENDED_RELEASE_TABLET | Freq: Every day | ORAL | 0 refills | Status: AC
Start: 2023-03-25 — End: 2023-03-30

## 2023-03-25 MED ORDER — PHENOL 1.4 % MT LIQD
1.0000 | OROMUCOSAL | Status: DC | PRN
  Administered 2023-03-25: 1 via OROMUCOSAL
  Filled 2023-03-25: qty 177

## 2023-03-25 MED ORDER — VITAMIN B-1 100 MG PO TABS: 100.0000 mg | ORAL_TABLET | Freq: Every day | ORAL | 0 refills | Status: AC

## 2023-03-25 NOTE — TOC CM/SW Note (Signed)
 Transition of Care [facility]) - Inpatient Brief Assessment   Patient Details  Name: Warren [name] MRN: [id number] Date of Birth: 1970/10/01  Transition of [facility] CM/SW Contact:    Mitzie LOISE Pinal, LCSW Phone Number: 03/25/2023, 12:03 PM   Clinical Narrative: Patient does not have a PCP but does have insurance. Substance abuse resources have been added to patient's AVS.   Transition of Care Asessment: Insurance and Status: Insurance coverage has been reviewed Patient has primary care physician: No Home environment has been reviewed: Yes Prior level of function:: Independent Prior/Current Home Services: No current home services Social Drivers of Health Review: SDOH reviewed no interventions necessary Readmission risk has been reviewed: Yes Transition of care needs: transition of care needs identified, TOC will continue to follow

## 2023-03-25 NOTE — Discharge Summary (Signed)
 Physician Discharge Summary  Warren [name] FMW:[id number] DOB: [birth year] DOA: 03/23/2023  PCP: Ransom Other, MD  Admit date: 03/23/2023 Discharge date: 03/25/2023  Admitted From: Home  Discharge disposition: Home   Recommendations for Outpatient Follow-Up:   Follow up with your primary care provider in one week.  Check CBC, BMP, magnesium  in the next visit   Discharge Diagnosis:   Principal Problem:   Alcohol withdrawal (HCC) Active Problems:   Chronic alcohol abuse   Hypokalemia   High anion gap metabolic acidosis   Transaminitis   Essential hypertension   HLD (hyperlipidemia)   GERD (gastroesophageal reflux disease)   Discharge Condition: Improved.  Diet recommendation: Regular.  Wound care: None.  Code status: Full.   History of Present Illness:   Warren [name] is a 53 y.o. male with medical history significant for chronic alcohol abuse, essential hypertension, hyperlipidemia, GERD, presented to [facility] ED with tremors dizziness and requesting alcohol detoxification.  Initially patient received a dose of phenobarbital  and was discharged with a prescription for Librium  taper.  However, before the patient was able to obtain the Librium  from the outpatient pharmacy, he felt that his tremulousness was worsening, prompting him to present back to [facility].  In the ED patient had stable vitals.  Labs showed hypokalemia with potassium of 3.2.  EKG showed sinus tachycardia.  Chest x-ray without any infiltrate.  Patient was then considered for admission to the hospital for further evaluation and treatment.    Hospital Course:   Following conditions were addressed during hospitalization as listed below,  Acute alcohol withdrawal:  Last drink 2 days prior to presentation.  Had tremors anxiety on presentation.  Patient received symptom based  CIWA protocol with prn Ativan ,. Seizure precautionsm. Telemetry.  Patient however remained stable and did not require Ativan   overnight.  Did not have any further withdrawal symptoms.  Advised to continue thiamine , folic acid , multivitamin.  Patient is motivated to quit and have encouraged him to seek outpatient rehabilitation services.  TOC has provided resources for substance use disorder.   Hypokalemia: Replaced during hospitalization.  Will be given 5 more day of potassium on discharge.  Anion gap metabolic acidosis: Improved at this time.  Received IV fluids.  Encourage oral hydration.    Elevated LFT.  Secondary to EtOH abuse.     Hyperlipidemia: On atorvastatin as outpatient.      Essential Hypertension: Continue HCTZ, Norvasc  and lisinopril ..      GERD: Continue Protonix .   Disposition.  At this time, patient is stable for disposition home with outpatient PCP follow-up.  Medical Consultants:   None.  Procedures:    None Subjective:   Today, patient was seen and examined at bedside.  Feels better.  No tremors hallucinations.  Did not require any Ativan  yesterday.  Discharge Exam:   Vitals:   03/25/23 1135 03/25/23 1144  BP: (!) 125/97 (!) 125/97  Pulse: 84   Resp: 13   Temp:    SpO2: 93%    Vitals:   03/25/23 1025 03/25/23 1133 03/25/23 1135 03/25/23 1144  BP: (!) 132/91 (!) 120/90 (!) 125/97 (!) 125/97  Pulse:  87 84   Resp:  19 13   Temp:      TempSrc:      SpO2:  93% 93%   Weight:      Height:       Body mass index is 38.25 kg/m.   General: Alert awake, not in obvious distress, obese HENT: pupils equally reacting to light,  No scleral pallor or icterus noted. Oral mucosa is moist.  Chest:  Clear breath sounds.  . No crackles or wheezes.  CVS: S1 &S2 heard. No murmur.  Regular rate and rhythm. Abdomen: Soft, nontender, nondistended.  Bowel sounds are heard.   Extremities: No cyanosis, clubbing or edema.  Peripheral pulses are palpable. Psych: Alert, awake and oriented, normal mood CNS:  No cranial nerve deficits.  Power equal in all extremities.   Skin: Warm and dry.  No  rashes noted.  The results of significant diagnostics from this hospitalization (including imaging, microbiology, ancillary and laboratory) are listed below for reference.     Diagnostic Studies:   DG Chest 2 View Result Date: 03/23/2023 CLINICAL DATA:  Tachycardia EXAM: CHEST - 2 VIEW COMPARISON:  X-ray 01/28/2023.  Older exams as well. FINDINGS: No consolidation, pneumothorax or effusion. No edema. Normal cardiopericardial silhouette. Overlapping cardiac leads. IMPRESSION: No acute cardiopulmonary disease. Electronically Signed   By: Ranell Bring M.D.   On: 03/23/2023 16:04     Labs:   Basic Metabolic Panel: Recent Labs  Lab 03/23/23 1258 03/24/23 0319 03/25/23 0306  NA 137 136 135  K 3.2* 2.6* 3.2*  CL 94* 95* 102  CO2 21* 26 25  GLUCOSE 131* 100* 114*  BUN 12 8 7   CREATININE 1.09 0.78 0.94  CALCIUM 9.2 9.0 8.6*  MG 1.7 1.7 2.1  PHOS  --  3.3  --    GFR Estimated Creatinine Clearance: 118.9 mL/min (by C-G formula based on SCr of 0.94 mg/dL). Liver Function Tests: Recent Labs  Lab 03/23/23 1258 03/24/23 0319  AST 99* 72*  ALT 80* 61*  ALKPHOS 40 33*  BILITOT 1.1 1.5*  PROT 7.5 6.8  ALBUMIN 4.5 3.9   No results for input(s): LIPASE, AMYLASE in the last 168 hours. No results for input(s): AMMONIA in the last 168 hours. Coagulation profile Recent Labs  Lab 03/24/23 0338  INR 1.1    CBC: Recent Labs  Lab 03/23/23 1258 03/24/23 0319 03/25/23 0306  WBC 7.3 8.4 7.3  NEUTROABS 2.4 4.8  --   HGB 16.0 15.3 14.4  HCT 45.0 43.8 43.2  MCV 87.2 88.8 92.3  PLT 315 257 245   Cardiac Enzymes: Recent Labs  Lab 03/24/23 0338  CKTOTAL 704*   BNP: Invalid input(s): POCBNP CBG: Recent Labs  Lab 03/23/23 1329  GLUCAP 130*   D-Dimer No results for input(s): DDIMER in the last 72 hours. Hgb A1c No results for input(s): HGBA1C in the last 72 hours. Lipid Profile No results for input(s): CHOL, HDL, LDLCALC, TRIG, CHOLHDL, LDLDIRECT  in the last 72 hours. Thyroid function studies No results for input(s): TSH, T4TOTAL, T3FREE, THYROIDAB in the last 72 hours.  Invalid input(s): FREET3 Anemia work up No results for input(s): VITAMINB12, FOLATE, FERRITIN, TIBC, IRON, RETICCTPCT in the last 72 hours. Microbiology Recent Results (from the past 240 hours)  MRSA Next Gen by PCR, Nasal     Status: None   Collection Time: 03/24/23  1:40 AM   Specimen: Nasal Mucosa; Nasal Swab  Result Value Ref Range Status   MRSA by PCR Next Gen NOT DETECTED NOT DETECTED Final    Comment: (NOTE) The GeneXpert MRSA Assay (FDA approved for NASAL specimens only), is one component of a comprehensive MRSA colonization surveillance program. It is not intended to diagnose MRSA infection nor to guide or monitor treatment for MRSA infections. Test performance is not FDA approved in patients less than 85 years old. Performed at [facility]  [facility], [address], [place], [place] [address]      Discharge Instructions:   Discharge Instructions     Diet general   Complete by: As directed    Discharge instructions   Complete by: As directed    Follow-up with your primary care provider in 1 week.  Check blood work at that time.  Continue to take medications and supplements as prescribed.  Follow-up with outpatient substance treatment centers.  Seek medical attention for worsening symptoms.   Increase activity slowly   Complete by: As directed       Allergies as of 03/25/2023   No Known Allergies      Medication List     STOP taking these medications    oxyCODONE -acetaminophen  5-325 MG tablet Commonly known as: PERCOCET/ROXICET       TAKE these medications    allopurinol  100 MG tablet Commonly known as: ZYLOPRIM  Take 100 mg by mouth daily.   amLODipine -benazepril 10-20 MG capsule Commonly known as: LOTREL Take 1 capsule by mouth daily.   atorvastatin 10 MG tablet Commonly known as:  LIPITOR Take 10 mg by mouth daily.   chlordiazePOXIDE  25 MG capsule Commonly known as: LIBRIUM  50mg  PO TID x 1D, then 25-50mg  PO BID X 1D, then 25-50mg  PO QD X 1D   colchicine  0.6 MG tablet Take 0.6 mg by mouth 2 (two) times daily as needed.   folic acid  1 MG tablet Commonly known as: FOLVITE  Take 1 tablet (1 mg total) by mouth daily. Start taking on: March 26, 2023   hydrochlorothiazide  12.5 MG tablet Commonly known as: HYDRODIURIL  Take 2 tablets (25 mg total) by mouth daily.   ibuprofen  600 MG tablet Commonly known as: ADVIL  Take 1 tablet (600 mg total) by mouth every 6 (six) hours as needed.   multivitamin with minerals Tabs tablet Take 1 tablet by mouth daily. Start taking on: March 26, 2023   pantoprazole  40 MG tablet Commonly known as: PROTONIX  Take 40 mg by mouth daily.   potassium chloride  SA 20 MEQ tablet Commonly known as: KLOR-CON  M Take 1 tablet (20 mEq total) by mouth daily for 5 days.   thiamine  100 MG tablet Commonly known as: Vitamin B-1 Take 1 tablet (100 mg total) by mouth daily. Start taking on: March 26, 2023        Follow-up Information     [facility] Follow up.   Specialty: Behavioral Health Contact information: [address] [place]  [address] [phone]                 Time coordinating discharge: 39 minutes  Signed:  Turner Baillie  [facility] 03/25/2023, 1:28 PM
